# Patient Record
Sex: Female | Born: 1935 | Race: Black or African American | Hispanic: No | Marital: Single | Smoking: Current every day smoker
Health system: Southern US, Community
[De-identification: ages and names within clinical notes are randomized; demographics above are authoritative.]

## PROBLEM LIST (undated history)

## (undated) DIAGNOSIS — J45909 Unspecified asthma, uncomplicated: Secondary | ICD-10-CM

## (undated) DIAGNOSIS — J449 Chronic obstructive pulmonary disease, unspecified: Secondary | ICD-10-CM

## (undated) DIAGNOSIS — I509 Heart failure, unspecified: Secondary | ICD-10-CM

## (undated) DIAGNOSIS — K219 Gastro-esophageal reflux disease without esophagitis: Secondary | ICD-10-CM

## (undated) DIAGNOSIS — E7800 Pure hypercholesterolemia, unspecified: Secondary | ICD-10-CM

## (undated) DIAGNOSIS — N289 Disorder of kidney and ureter, unspecified: Secondary | ICD-10-CM

## (undated) HISTORY — PX: ABDOMINAL HYSTERECTOMY: SHX81

---

## 2018-04-06 ENCOUNTER — Emergency Department (HOSPITAL_COMMUNITY): Payer: Medicare Other

## 2018-04-06 ENCOUNTER — Inpatient Hospital Stay (HOSPITAL_COMMUNITY)
Admission: EM | Admit: 2018-04-06 | Discharge: 2018-04-10 | DRG: 190 | Disposition: A | Discharge status: Home / Self Care | Payer: Medicare Other | Attending: Internal Medicine | Admitting: Internal Medicine

## 2018-04-06 ENCOUNTER — Encounter (HOSPITAL_COMMUNITY): Payer: Self-pay | Admitting: Emergency Medicine

## 2018-04-06 DIAGNOSIS — R0602 Shortness of breath: Secondary | ICD-10-CM | POA: Diagnosis not present

## 2018-04-06 DIAGNOSIS — I130 Hypertensive heart and chronic kidney disease with heart failure and stage 1 through stage 4 chronic kidney disease, or unspecified chronic kidney disease: Secondary | ICD-10-CM | POA: Diagnosis present

## 2018-04-06 DIAGNOSIS — I639 Cerebral infarction, unspecified: Secondary | ICD-10-CM

## 2018-04-06 DIAGNOSIS — I2510 Atherosclerotic heart disease of native coronary artery without angina pectoris: Secondary | ICD-10-CM

## 2018-04-06 DIAGNOSIS — E7800 Pure hypercholesterolemia, unspecified: Secondary | ICD-10-CM | POA: Diagnosis present

## 2018-04-06 DIAGNOSIS — E785 Hyperlipidemia, unspecified: Secondary | ICD-10-CM | POA: Diagnosis present

## 2018-04-06 DIAGNOSIS — N184 Chronic kidney disease, stage 4 (severe): Secondary | ICD-10-CM | POA: Diagnosis present

## 2018-04-06 DIAGNOSIS — N183 Chronic kidney disease, stage 3 unspecified: Secondary | ICD-10-CM

## 2018-04-06 DIAGNOSIS — F0390 Unspecified dementia without behavioral disturbance: Secondary | ICD-10-CM | POA: Diagnosis present

## 2018-04-06 DIAGNOSIS — Z8673 Personal history of transient ischemic attack (TIA), and cerebral infarction without residual deficits: Secondary | ICD-10-CM

## 2018-04-06 DIAGNOSIS — R0989 Other specified symptoms and signs involving the circulatory and respiratory systems: Secondary | ICD-10-CM

## 2018-04-06 DIAGNOSIS — J441 Chronic obstructive pulmonary disease with (acute) exacerbation: Secondary | ICD-10-CM | POA: Diagnosis not present

## 2018-04-06 DIAGNOSIS — D631 Anemia in chronic kidney disease: Secondary | ICD-10-CM | POA: Diagnosis present

## 2018-04-06 DIAGNOSIS — E872 Acidosis: Secondary | ICD-10-CM | POA: Diagnosis present

## 2018-04-06 DIAGNOSIS — J440 Chronic obstructive pulmonary disease with acute lower respiratory infection: Secondary | ICD-10-CM | POA: Diagnosis present

## 2018-04-06 DIAGNOSIS — K219 Gastro-esophageal reflux disease without esophagitis: Secondary | ICD-10-CM | POA: Diagnosis present

## 2018-04-06 DIAGNOSIS — E875 Hyperkalemia: Secondary | ICD-10-CM | POA: Diagnosis not present

## 2018-04-06 DIAGNOSIS — J189 Pneumonia, unspecified organism: Secondary | ICD-10-CM | POA: Diagnosis present

## 2018-04-06 DIAGNOSIS — I248 Other forms of acute ischemic heart disease: Secondary | ICD-10-CM | POA: Diagnosis present

## 2018-04-06 DIAGNOSIS — R778 Other specified abnormalities of plasma proteins: Secondary | ICD-10-CM

## 2018-04-06 DIAGNOSIS — F17210 Nicotine dependence, cigarettes, uncomplicated: Secondary | ICD-10-CM | POA: Diagnosis present

## 2018-04-06 DIAGNOSIS — R7989 Other specified abnormal findings of blood chemistry: Secondary | ICD-10-CM

## 2018-04-06 DIAGNOSIS — Z8249 Family history of ischemic heart disease and other diseases of the circulatory system: Secondary | ICD-10-CM

## 2018-04-06 DIAGNOSIS — I5042 Chronic combined systolic (congestive) and diastolic (congestive) heart failure: Secondary | ICD-10-CM | POA: Diagnosis present

## 2018-04-06 DIAGNOSIS — D696 Thrombocytopenia, unspecified: Secondary | ICD-10-CM | POA: Diagnosis present

## 2018-04-06 HISTORY — DX: Chronic obstructive pulmonary disease, unspecified: J44.9

## 2018-04-06 HISTORY — DX: Disorder of kidney and ureter, unspecified: N28.9

## 2018-04-06 HISTORY — DX: Pure hypercholesterolemia, unspecified: E78.00

## 2018-04-06 HISTORY — DX: Gastro-esophageal reflux disease without esophagitis: K21.9

## 2018-04-06 HISTORY — DX: Heart failure, unspecified: I50.9

## 2018-04-06 HISTORY — DX: Unspecified asthma, uncomplicated: J45.909

## 2018-04-06 LAB — CBC WITH DIFFERENTIAL/PLATELET
Abs Immature Granulocytes: 0.02 10*3/uL (ref 0.00–0.07)
Basophils Absolute: 0.1 10*3/uL (ref 0.0–0.1)
Basophils Relative: 1 %
Eosinophils Absolute: 0.1 10*3/uL (ref 0.0–0.5)
Eosinophils Relative: 1 %
HCT: 32.2 % — ABNORMAL LOW (ref 36.0–46.0)
Hemoglobin: 10 g/dL — ABNORMAL LOW (ref 12.0–15.0)
Immature Granulocytes: 0 %
LYMPHS PCT: 27 %
Lymphs Abs: 1.8 10*3/uL (ref 0.7–4.0)
MCH: 28.5 pg (ref 26.0–34.0)
MCHC: 31.1 g/dL (ref 30.0–36.0)
MCV: 91.7 fL (ref 80.0–100.0)
MONO ABS: 0.6 10*3/uL (ref 0.1–1.0)
Monocytes Relative: 8 %
NEUTROS ABS: 4.2 10*3/uL (ref 1.7–7.7)
Neutrophils Relative %: 63 %
Platelets: 126 10*3/uL — ABNORMAL LOW (ref 150–400)
RBC: 3.51 MIL/uL — AB (ref 3.87–5.11)
RDW: 17.6 % — ABNORMAL HIGH (ref 11.5–15.5)
WBC: 6.8 10*3/uL (ref 4.0–10.5)
nRBC: 0.4 % — ABNORMAL HIGH (ref 0.0–0.2)

## 2018-04-06 LAB — BRAIN NATRIURETIC PEPTIDE: B Natriuretic Peptide: 3300.9 pg/mL — ABNORMAL HIGH (ref 0.0–100.0)

## 2018-04-06 LAB — BASIC METABOLIC PANEL
Anion gap: 10 (ref 5–15)
BUN: 32 mg/dL — AB (ref 8–23)
CHLORIDE: 109 mmol/L (ref 98–111)
CO2: 17 mmol/L — ABNORMAL LOW (ref 22–32)
CREATININE: 2.09 mg/dL — AB (ref 0.44–1.00)
Calcium: 9 mg/dL (ref 8.9–10.3)
GFR calc Af Amer: 24 mL/min — ABNORMAL LOW (ref >60)
GFR calc non Af Amer: 21 mL/min — ABNORMAL LOW (ref >60)
Glucose, Bld: 92 mg/dL (ref 70–99)
POTASSIUM: 4.6 mmol/L (ref 3.5–5.1)
Sodium: 136 mmol/L (ref 135–145)

## 2018-04-06 LAB — TROPONIN I: Troponin I: 0.05 ng/mL (ref <0.03)

## 2018-04-06 MED ORDER — ALBUTEROL (5 MG/ML) CONTINUOUS INHALATION SOLN
10.0000 mg/h | INHALATION_SOLUTION | Freq: Once | RESPIRATORY_TRACT | Status: AC
  Administered 2018-04-06: 10 mg/h via RESPIRATORY_TRACT
  Filled 2018-04-06: qty 20

## 2018-04-06 MED ORDER — SODIUM CHLORIDE 0.9 % IV BOLUS: 1000.0000 mL | Freq: Once | INTRAVENOUS | Status: DC

## 2018-04-06 MED ORDER — MAGNESIUM SULFATE 2 GM/50ML IV SOLN
2.0000 g | Freq: Once | INTRAVENOUS | Status: AC
  Administered 2018-04-06: 2 g via INTRAVENOUS
  Filled 2018-04-06: qty 50

## 2018-04-06 MED ORDER — ALBUTEROL (5 MG/ML) CONTINUOUS INHALATION SOLN
10.0000 mg/h | INHALATION_SOLUTION | Freq: Once | RESPIRATORY_TRACT | Status: AC
  Administered 2018-04-06: 10 mg/h via RESPIRATORY_TRACT

## 2018-04-06 MED ORDER — ALBUTEROL SULFATE (2.5 MG/3ML) 0.083% IN NEBU
5.0000 mg | INHALATION_SOLUTION | Freq: Once | RESPIRATORY_TRACT | Status: AC
  Administered 2018-04-06: 5 mg via RESPIRATORY_TRACT
  Filled 2018-04-06: qty 6

## 2018-04-06 MED ORDER — METHYLPREDNISOLONE SODIUM SUCC 125 MG IJ SOLR
125.0000 mg | Freq: Once | INTRAMUSCULAR | Status: AC
  Administered 2018-04-06: 125 mg via INTRAVENOUS
  Filled 2018-04-06: qty 2

## 2018-04-06 MED ORDER — IPRATROPIUM BROMIDE 0.02 % IN SOLN
1.0000 mg | Freq: Once | RESPIRATORY_TRACT | Status: AC
  Administered 2018-04-06: 1 mg via RESPIRATORY_TRACT
  Filled 2018-04-06: qty 5

## 2018-04-06 NOTE — ED Notes (Signed)
See providers notes

## 2018-04-06 NOTE — H&P (Addendum)
PCP:   Patient, No Pcp Per    Code status: Full Code  Chief Complaint:  SOB, wheezing  HPI: this is a 85 year old female who is [employer].  She lived in [place] until last week when she moved to the area.  She normally gets her care at [facility].  She has a history of COPD and ran out of her albuterol. She has been SOB today.she has a dry nobproductive cough and wheezing. She has a chronic wheeze but it is worse. She denies any fevers or chills. She vomited once today. She denies any diarrhea. She denies any myalgias, light headedness or dizziness. She has a h/o COPD and tobacco use. She is not on home oxygen. She an inhaler at home burt not a nebulizer. She ran out of her inhaler yesterday. She uses her inhaler several times daily at baseline.  History provided by patient and family present at bedside.   Review of Systems:  The patient denies anorexia, fever, weight loss,, vision loss, decreased hearing, hoarseness, SOB, wheezing, , chest pain, syncope, dyspnea on exertion, peripheral edema, balance deficits, hemoptysis, abdominal pain, melena, hematochezia, severe indigestion/heartburn, hematuria, incontinence, genital sores, muscle weakness, suspicious skin lesions, transient blindness, difficulty walking, depression, unusual weight change, abnormal bleeding, enlarged lymph nodes, angioedema, and breast masses.  Past Medical History: Past Medical History:  Diagnosis Date  . Asthma   . CHF (congestive heart failure) ([place])   . COPD (chronic obstructive pulmonary disease) ([place])   . GERD (gastroesophageal reflux disease)   . High cholesterol   . Renal disorder    History reviewed. No pertinent surgical history.  Medications: Prior to Admission medications   REVIEWED    Allergies:  No Known Allergies  Social History:  reports that she has been smoking cigarettes. She has been smoking about 1.00 pack per day. She has never used smokeless tobacco. She reports previous  alcohol use. She reports previous drug use.  Family History: Hypertension  Physical Exam: Vitals:   04/06/18 2130 04/06/18 2145 04/06/18 2200 04/06/18 2313  BP: (!) 164/103 (!) 177/105 (!) 168/96   Pulse: 73  77   Resp: (!) 28 (!) 23 17   Temp:      TempSrc:      SpO2: 97%  100% 98%  Weight:      Height:        General:  Alert and oriented times three, well developed, slender, no acute distress Eyes: PERRLA, pink conjunctiva, no scleral icterus ENT: Moist oral mucosa, neck supple, no thyromegaly Lungs: clear to ascultation, no significant wheeze, no crackles, no use of accessory muscles Cardiovascular: regular rate and rhythm, no regurgitation, no gallops, no murmurs. No carotid bruits, no JVD Abdomen: soft, positive BS, non-tender, non-distended, no organomegaly, not an acute abdomen GU: not examined Neuro: CN II - XII grossly intact, sensation intact Musculoskeletal: strength 5/5 all extremities, no clubbing, cyanosis or edema Skin: no rash, no subcutaneous crepitation, no decubitus Psych: appropriate patient   Labs on Admission:  Recent Labs    04/06/18 2217  NA 136  K 4.6  CL 109  CO2 17*  GLUCOSE 92  BUN 32*  CREATININE 2.09*  CALCIUM 9.0   No results for input(s): AST, ALT, ALKPHOS, BILITOT, PROT, ALBUMIN in the last 72 hours. No results for input(s): LIPASE, AMYLASE in the last 72 hours. Recent Labs    04/06/18 2217  WBC 6.8  NEUTROABS 4.2  HGB 10.0*  HCT 32.2*  MCV 91.7  PLT 126*  Recent Labs    04/06/18 2217  TROPONINI 0.05*   Invalid input(s): POCBNP No results for input(s): DDIMER in the last 72 hours. No results for input(s): HGBA1C in the last 72 hours. No results for input(s): CHOL, HDL, LDLCALC, TRIG, CHOLHDL, LDLDIRECT in the last 72 hours. No results for input(s): TSH, T4TOTAL, T3FREE, THYROIDAB in the last 72 hours.  Invalid input(s): FREET3 No results for input(s): VITAMINB12, FOLATE, FERRITIN, TIBC, IRON, RETICCTPCT in the last  72 hours.  Micro Results: No results found for this or any previous visit (from the past 240 hour(s)).   Radiological Exams on Admission: Dg Chest 2 View  Result Date: 04/06/2018 CLINICAL DATA:  Initial evaluation for acute shortness of breath. History of COPD. EXAM: CHEST - 2 VIEW COMPARISON:  None available. FINDINGS: Cardiomegaly. Mediastinal silhouette within normal limits. Aortic atherosclerosis. Lungs hyperinflated with evidence for upper lobe predominant emphysema. Irregular pleuroparenchymal scarring at the bilateral lung apices, right greater than left. No focal infiltrates. No pulmonary edema or pleural effusion. No pneumothorax. No acute osseous abnormality.  Osteopenia noted. IMPRESSION: 1. COPD.  No superimposed active cardiopulmonary disease. 2. Cardiomegaly without pulmonary edema. 3. Aortic atherosclerosis. Electronically Signed   By: Jeannine Boga M.D.   On: 04/06/2018 21:44    Assessment/Plan Present on Admission: . COPD with acute exacerbation ([place]) -Admit to MedSurg -Solu-Medrol, duo nebs, oxygen as needed keep sats greater than 88% -No antibiotics ordered as no evidence of pneumonia -Respiratory to evaluate and treat  Tobacco use -Nicotine patch 21 Mg daily, DuoNeb  H/o CHF -Aware  CKD -Aware, will follow.  No baseline labs to compare  H/o CVA -Stable, home meds resumed  Hypertension -Stable, home meds resumed  Dyslipidemia -Stable, home meds resumed   Blaine Guiffre 04/06/2018, 11:35 PM

## 2018-04-06 NOTE — ED Triage Notes (Signed)
Per family pt has been SOB X1 day, pt is out of inhaler. Family states she is also out of all her meds.

## 2018-04-06 NOTE — ED Provider Notes (Signed)
[facility] EMERGENCY DEPARTMENT Provider Note   CSN: [id number] Arrival date & time: 04/06/18  2047     History   Chief Complaint Chief Complaint  Patient presents with  . Shortness of Breath    HPI Maureen Mendez is a 85 y.o. female.  HPI   85 yo F with h/o CHF, HLD, COPD, here with SOB. History limited 2/2 mild baseline dementia and SOB. Pt states that she has recently run out of albuterol. Over the past 2-3 days, she's had increasing SOB, wheezing though family states her sx acutely worsened tonight. She began having increased WOB and complaining of SOB, along with non productive cough. They did not have inhaler so came to the ED. On my assessment, pt does state that she cannot catch her breath. Denies any CP. No LE edema or swelling. No abd pain.  Level 5 caveat invoked as remainder of history, ROS, and physical exam limited due to patient's SOB, dementia.   Past Medical History:  Diagnosis Date  . Asthma   . CHF (congestive heart failure) ([place])   . COPD (chronic obstructive pulmonary disease) ([place])   . GERD (gastroesophageal reflux disease)   . High cholesterol   . Renal disorder     There are no active problems to display for this patient.   History reviewed. No pertinent surgical history.   OB History   No obstetric history on file.      Home Medications    Prior to Admission medications   Not on File    Family History No family history on file.  Social History Social History   Tobacco Use  . Smoking status: Current Every Day Smoker    Packs/day: 1.00    Types: Cigarettes  . Smokeless tobacco: Never Used  Substance Use Topics  . Alcohol use: Not Currently  . Drug use: Not Currently     Allergies   Patient has no known allergies.   Review of Systems Review of Systems  Constitutional: Positive for fatigue. Negative for chills and fever.  HENT: Negative for congestion and rhinorrhea.   Eyes: Negative for visual  disturbance.  Respiratory: Positive for cough, shortness of breath and wheezing.   Cardiovascular: Negative for chest pain and leg swelling.  Gastrointestinal: Negative for abdominal pain, diarrhea, nausea and vomiting.  Genitourinary: Negative for dysuria and flank pain.  Musculoskeletal: Negative for neck pain and neck stiffness.  Skin: Negative for rash and wound.  Allergic/Immunologic: Negative for immunocompromised state.  Neurological: Negative for syncope, weakness and headaches.  All other systems reviewed and are negative.    Physical Exam Updated Vital Signs BP (!) 168/96   Pulse 77   Temp 98.1 F (36.7 C) (Oral)   Resp 17   Ht 5\' 6"  (1.676 m)   Wt 61.2 kg   SpO2 98%   BMI 21.79 kg/m   Physical Exam Vitals signs and nursing note reviewed.  Constitutional:      General: She is in acute distress.     Appearance: She is well-developed. She is ill-appearing.  HENT:     Head: Normocephalic and atraumatic.  Eyes:     Conjunctiva/sclera: Conjunctivae normal.  Neck:     Musculoskeletal: Neck supple.  Cardiovascular:     Rate and Rhythm: Normal rate and regular rhythm.     Heart sounds: Normal heart sounds. No murmur. No friction rub.  Pulmonary:     Effort: Tachypnea and respiratory distress present.     Breath sounds:  Examination of the right-upper field reveals wheezing. Examination of the left-upper field reveals wheezing. Examination of the right-middle field reveals wheezing. Examination of the left-middle field reveals wheezing. Examination of the right-lower field reveals wheezing. Examination of the left-lower field reveals wheezing. Decreased breath sounds and wheezing present. No rales.  Abdominal:     General: There is no distension.     Palpations: Abdomen is soft.     Tenderness: There is no abdominal tenderness.  Skin:    General: Skin is warm.     Capillary Refill: Capillary refill takes less than 2 seconds.  Neurological:     Mental Status: She is  alert and oriented to person, place, and time.     Motor: No abnormal muscle tone.      ED Treatments / Results  Labs (all labs ordered are listed, but only abnormal results are displayed) Labs Reviewed  CBC WITH DIFFERENTIAL/PLATELET - Abnormal; Notable for the following components:      Result Value   RBC 3.51 (*)    Hemoglobin 10.0 (*)    HCT 32.2 (*)    RDW 17.6 (*)    Platelets 126 (*)    nRBC 0.4 (*)    All other components within normal limits  BASIC METABOLIC PANEL - Abnormal; Notable for the following components:   CO2 17 (*)    BUN 32 (*)    Creatinine, Ser 2.09 (*)    GFR calc non Af Amer 21 (*)    GFR calc Af Amer 24 (*)    All other components within normal limits  TROPONIN I - Abnormal; Notable for the following components:   Troponin I 0.05 (*)    All other components within normal limits  BRAIN NATRIURETIC PEPTIDE    EKG EKG Interpretation  Date/Time:  Sunday April 06 2018 21:26:37 EST Ventricular Rate:  78 PR Interval:    QRS Duration: 84 QT Interval:  443 QTC Calculation: 495 R Axis:   148 Text Interpretation:  Sinus rhythm Ventricular premature complex Probable left atrial enlargement Right axis deviation Borderline low voltage, extremity leads Abnormal lateral Q waves Nonspecific T abnormalities, inferior leads No old tracing to compare No ST Elevations  Confirmed by Duffy Bruce [id number]) on 04/06/2018 11:25:14 PM   Radiology Dg Chest 2 View  Result Date: 04/06/2018 CLINICAL DATA:  Initial evaluation for acute shortness of breath. History of COPD. EXAM: CHEST - 2 VIEW COMPARISON:  None available. FINDINGS: Cardiomegaly. Mediastinal silhouette within normal limits. Aortic atherosclerosis. Lungs hyperinflated with evidence for upper lobe predominant emphysema. Irregular pleuroparenchymal scarring at the bilateral lung apices, right greater than left. No focal infiltrates. No pulmonary edema or pleural effusion. No pneumothorax. No acute osseous  abnormality.  Osteopenia noted. IMPRESSION: 1. COPD.  No superimposed active cardiopulmonary disease. 2. Cardiomegaly without pulmonary edema. 3. Aortic atherosclerosis. Electronically Signed   By: Jeannine Boga M.D.   On: 04/06/2018 21:44    Procedures .Critical Care Performed by: Duffy Bruce, MD Authorized by: Duffy Bruce, MD   Critical care provider statement:    Critical care time (minutes):  35   Critical care time was exclusive of:  Separately billable procedures and treating other patients and teaching time   Critical care was necessary to treat or prevent imminent or life-threatening deterioration of the following conditions:  Circulatory failure, cardiac failure and respiratory failure   Critical care was time spent personally by me on the following activities:  Development of treatment plan with patient or surrogate, discussions  with consultants, evaluation of patient's response to treatment, examination of patient, obtaining history from patient or surrogate, ordering and performing treatments and interventions, ordering and review of laboratory studies, ordering and review of radiographic studies, pulse oximetry, re-evaluation of patient's condition and review of old charts   I assumed direction of critical care for this patient from another provider in my specialty: no     (including critical care time)  Medications Ordered in ED Medications  sodium chloride 0.9 % bolus 1,000 mL (has no administration in time range)  albuterol (PROVENTIL) (2.5 MG/3ML) 0.083% nebulizer solution 5 mg (5 mg Nebulization Given 04/06/18 2100)  methylPREDNISolone sodium succinate (SOLU-MEDROL) 125 mg/2 mL injection 125 mg (125 mg Intravenous Given 04/06/18 2225)  albuterol (PROVENTIL,VENTOLIN) solution continuous neb (10 mg/hr Nebulization Given 04/06/18 2129)  ipratropium (ATROVENT) nebulizer solution 1 mg (1 mg Nebulization Given 04/06/18 2129)  magnesium sulfate IVPB 2 g 50 mL (2 g  Intravenous New Bag/Given 04/06/18 2226)  albuterol (PROVENTIL,VENTOLIN) solution continuous neb (10 mg/hr Nebulization Given 04/06/18 2313)     Initial Impression / Assessment and Plan / ED Course  I have reviewed the triage vital signs and the nursing notes.  Pertinent labs & imaging results that were available during my care of the patient were reviewed by me and considered in my medical decision making (see chart for details).     86 yo F with PMHx COPD, CHF here with SOB after running out her albuterol. Pt in obvious resp distress on arrival but speaking in full sentences. CAT, steroids, mag given with significant improvement in aeration and WOB. She feels much better but remains tachypneic and satting 90% on RA. Will give additional nebs, admit for COPD exacerbation. Of note, labs also show likely AKI with dehydration, as well as likely demand ischemia. IVF given. No CP currently, EKG is without ST elevations.  Final Clinical Impressions(s) / ED Diagnoses   Final diagnoses:  COPD exacerbation [facility])    ED Discharge Orders    None       Duffy Bruce, MD 04/06/18 2328

## 2018-04-07 ENCOUNTER — Encounter (HOSPITAL_COMMUNITY): Payer: Self-pay | Admitting: General Practice

## 2018-04-07 ENCOUNTER — Other Ambulatory Visit: Payer: Self-pay

## 2018-04-07 DIAGNOSIS — I361 Nonrheumatic tricuspid (valve) insufficiency: Secondary | ICD-10-CM | POA: Diagnosis not present

## 2018-04-07 DIAGNOSIS — E7800 Pure hypercholesterolemia, unspecified: Secondary | ICD-10-CM | POA: Diagnosis present

## 2018-04-07 DIAGNOSIS — Z8673 Personal history of transient ischemic attack (TIA), and cerebral infarction without residual deficits: Secondary | ICD-10-CM | POA: Diagnosis not present

## 2018-04-07 DIAGNOSIS — R7989 Other specified abnormal findings of blood chemistry: Secondary | ICD-10-CM

## 2018-04-07 DIAGNOSIS — I2510 Atherosclerotic heart disease of native coronary artery without angina pectoris: Secondary | ICD-10-CM | POA: Diagnosis present

## 2018-04-07 DIAGNOSIS — J440 Chronic obstructive pulmonary disease with acute lower respiratory infection: Secondary | ICD-10-CM | POA: Diagnosis present

## 2018-04-07 DIAGNOSIS — F0390 Unspecified dementia without behavioral disturbance: Secondary | ICD-10-CM | POA: Diagnosis present

## 2018-04-07 DIAGNOSIS — N184 Chronic kidney disease, stage 4 (severe): Secondary | ICD-10-CM | POA: Diagnosis present

## 2018-04-07 DIAGNOSIS — I5042 Chronic combined systolic (congestive) and diastolic (congestive) heart failure: Secondary | ICD-10-CM | POA: Diagnosis present

## 2018-04-07 DIAGNOSIS — D696 Thrombocytopenia, unspecified: Secondary | ICD-10-CM | POA: Diagnosis present

## 2018-04-07 DIAGNOSIS — J441 Chronic obstructive pulmonary disease with (acute) exacerbation: Secondary | ICD-10-CM | POA: Diagnosis present

## 2018-04-07 DIAGNOSIS — J189 Pneumonia, unspecified organism: Secondary | ICD-10-CM | POA: Diagnosis present

## 2018-04-07 DIAGNOSIS — E875 Hyperkalemia: Secondary | ICD-10-CM | POA: Diagnosis not present

## 2018-04-07 DIAGNOSIS — I130 Hypertensive heart and chronic kidney disease with heart failure and stage 1 through stage 4 chronic kidney disease, or unspecified chronic kidney disease: Secondary | ICD-10-CM | POA: Diagnosis present

## 2018-04-07 DIAGNOSIS — E872 Acidosis: Secondary | ICD-10-CM | POA: Diagnosis present

## 2018-04-07 DIAGNOSIS — K219 Gastro-esophageal reflux disease without esophagitis: Secondary | ICD-10-CM | POA: Diagnosis present

## 2018-04-07 DIAGNOSIS — Z8249 Family history of ischemic heart disease and other diseases of the circulatory system: Secondary | ICD-10-CM | POA: Diagnosis not present

## 2018-04-07 DIAGNOSIS — F17210 Nicotine dependence, cigarettes, uncomplicated: Secondary | ICD-10-CM | POA: Diagnosis present

## 2018-04-07 DIAGNOSIS — I248 Other forms of acute ischemic heart disease: Secondary | ICD-10-CM | POA: Diagnosis present

## 2018-04-07 DIAGNOSIS — I340 Nonrheumatic mitral (valve) insufficiency: Secondary | ICD-10-CM | POA: Diagnosis not present

## 2018-04-07 DIAGNOSIS — R0602 Shortness of breath: Secondary | ICD-10-CM | POA: Diagnosis present

## 2018-04-07 DIAGNOSIS — E785 Hyperlipidemia, unspecified: Secondary | ICD-10-CM | POA: Diagnosis present

## 2018-04-07 DIAGNOSIS — D631 Anemia in chronic kidney disease: Secondary | ICD-10-CM | POA: Diagnosis present

## 2018-04-07 LAB — CBC
HCT: 29.5 % — ABNORMAL LOW (ref 36.0–46.0)
Hemoglobin: 9.2 g/dL — ABNORMAL LOW (ref 12.0–15.0)
MCH: 29 pg (ref 26.0–34.0)
MCHC: 31.2 g/dL (ref 30.0–36.0)
MCV: 93.1 fL (ref 80.0–100.0)
NRBC: 0.6 % — AB (ref 0.0–0.2)
PLATELETS: 124 10*3/uL — AB (ref 150–400)
RBC: 3.17 MIL/uL — ABNORMAL LOW (ref 3.87–5.11)
RDW: 17.5 % — ABNORMAL HIGH (ref 11.5–15.5)
WBC: 7.2 10*3/uL (ref 4.0–10.5)

## 2018-04-07 LAB — BASIC METABOLIC PANEL
ANION GAP: 12 (ref 5–15)
BUN: 30 mg/dL — ABNORMAL HIGH (ref 8–23)
CO2: 14 mmol/L — ABNORMAL LOW (ref 22–32)
Calcium: 8.7 mg/dL — ABNORMAL LOW (ref 8.9–10.3)
Chloride: 112 mmol/L — ABNORMAL HIGH (ref 98–111)
Creatinine, Ser: 2.04 mg/dL — ABNORMAL HIGH (ref 0.44–1.00)
GFR calc Af Amer: 25 mL/min — ABNORMAL LOW (ref >60)
GFR, EST NON AFRICAN AMERICAN: 22 mL/min — AB (ref >60)
Glucose, Bld: 130 mg/dL — ABNORMAL HIGH (ref 70–99)
Potassium: 4.6 mmol/L (ref 3.5–5.1)
Sodium: 138 mmol/L (ref 135–145)

## 2018-04-07 LAB — MAGNESIUM: Magnesium: 2.5 mg/dL — ABNORMAL HIGH (ref 1.7–2.4)

## 2018-04-07 MED ORDER — HYDROXYZINE HCL 10 MG PO TABS
10.0000 mg | ORAL_TABLET | Freq: Three times a day (TID) | ORAL | Status: DC | PRN
  Filled 2018-04-07: qty 1

## 2018-04-07 MED ORDER — ASPIRIN EC 81 MG PO TBEC
81.0000 mg | DELAYED_RELEASE_TABLET | Freq: Every day | ORAL | Status: DC
  Administered 2018-04-07 – 2018-04-10 (×4): 81 mg via ORAL
  Filled 2018-04-07 (×4): qty 1

## 2018-04-07 MED ORDER — METOPROLOL SUCCINATE ER 25 MG PO TB24
25.0000 mg | ORAL_TABLET | Freq: Every day | ORAL | Status: DC
  Administered 2018-04-07 – 2018-04-10 (×4): 25 mg via ORAL
  Filled 2018-04-07 (×4): qty 1

## 2018-04-07 MED ORDER — POLYETHYLENE GLYCOL 3350 17 G PO PACK: 17.0000 g | PACK | Freq: Every day | ORAL | Status: DC | PRN

## 2018-04-07 MED ORDER — HEPARIN SODIUM (PORCINE) 5000 UNIT/ML IJ SOLN
5000.0000 [IU] | Freq: Three times a day (TID) | INTRAMUSCULAR | Status: DC
  Administered 2018-04-07 – 2018-04-10 (×9): 5000 [IU] via SUBCUTANEOUS
  Filled 2018-04-07 (×9): qty 1

## 2018-04-07 MED ORDER — AZITHROMYCIN 500 MG PO TABS
250.0000 mg | ORAL_TABLET | Freq: Every day | ORAL | Status: DC
  Administered 2018-04-08 – 2018-04-10 (×3): 250 mg via ORAL
  Filled 2018-04-07 (×3): qty 1

## 2018-04-07 MED ORDER — ALUM & MAG HYDROXIDE-SIMETH 200-200-20 MG/5ML PO SUSP
15.0000 mL | Freq: Four times a day (QID) | ORAL | Status: DC | PRN
  Filled 2018-04-07: qty 30

## 2018-04-07 MED ORDER — DOCUSATE SODIUM 100 MG PO CAPS
100.0000 mg | ORAL_CAPSULE | Freq: Two times a day (BID) | ORAL | Status: DC | PRN
  Administered 2018-04-07: 100 mg via ORAL
  Filled 2018-04-07: qty 1

## 2018-04-07 MED ORDER — CALCITRIOL 0.25 MCG PO CAPS
0.5000 ug | ORAL_CAPSULE | ORAL | Status: DC
  Administered 2018-04-07 – 2018-04-10 (×4): 0.5 ug via ORAL
  Filled 2018-04-07: qty 2
  Filled 2018-04-07: qty 1
  Filled 2018-04-07 (×3): qty 2

## 2018-04-07 MED ORDER — ATORVASTATIN CALCIUM 80 MG PO TABS
80.0000 mg | ORAL_TABLET | Freq: Every evening | ORAL | Status: DC
  Administered 2018-04-07 – 2018-04-09 (×3): 80 mg via ORAL
  Filled 2018-04-07 (×3): qty 1

## 2018-04-07 MED ORDER — AZITHROMYCIN 500 MG PO TABS
500.0000 mg | ORAL_TABLET | Freq: Every day | ORAL | Status: AC
  Administered 2018-04-07: 500 mg via ORAL
  Filled 2018-04-07: qty 1

## 2018-04-07 MED ORDER — NICOTINE 21 MG/24HR TD PT24
21.0000 mg | MEDICATED_PATCH | Freq: Every day | TRANSDERMAL | Status: DC
  Administered 2018-04-07: 21 mg via TRANSDERMAL
  Filled 2018-04-07: qty 1

## 2018-04-07 MED ORDER — ACETAMINOPHEN 325 MG PO TABS
650.0000 mg | ORAL_TABLET | Freq: Four times a day (QID) | ORAL | Status: DC | PRN
  Administered 2018-04-07 – 2018-04-09 (×3): 650 mg via ORAL
  Filled 2018-04-07 (×3): qty 2

## 2018-04-07 MED ORDER — IPRATROPIUM-ALBUTEROL 0.5-2.5 (3) MG/3ML IN SOLN
3.0000 mL | Freq: Four times a day (QID) | RESPIRATORY_TRACT | Status: DC
  Administered 2018-04-07 – 2018-04-08 (×6): 3 mL via RESPIRATORY_TRACT
  Filled 2018-04-07 (×6): qty 3

## 2018-04-07 MED ORDER — ACETAMINOPHEN 650 MG RE SUPP: 650.0000 mg | Freq: Four times a day (QID) | RECTAL | Status: DC | PRN

## 2018-04-07 MED ORDER — METHYLPREDNISOLONE SODIUM SUCC 40 MG IJ SOLR
40.0000 mg | Freq: Two times a day (BID) | INTRAMUSCULAR | Status: DC
  Administered 2018-04-07 – 2018-04-10 (×7): 40 mg via INTRAVENOUS
  Filled 2018-04-07 (×7): qty 1

## 2018-04-07 MED ORDER — NICOTINE 7 MG/24HR TD PT24
7.0000 mg | MEDICATED_PATCH | Freq: Every day | TRANSDERMAL | Status: DC
  Administered 2018-04-07 – 2018-04-10 (×4): 7 mg via TRANSDERMAL
  Filled 2018-04-07 (×4): qty 1

## 2018-04-07 MED ORDER — METHYLPREDNISOLONE SODIUM SUCC 125 MG IJ SOLR: 60.0000 mg | Freq: Three times a day (TID) | INTRAMUSCULAR | Status: DC

## 2018-04-07 MED ORDER — PANTOPRAZOLE SODIUM 40 MG PO TBEC
40.0000 mg | DELAYED_RELEASE_TABLET | Freq: Every day | ORAL | Status: DC
  Administered 2018-04-07 – 2018-04-10 (×4): 40 mg via ORAL
  Filled 2018-04-07 (×4): qty 1

## 2018-04-07 NOTE — Progress Notes (Signed)
Patient complaining of lower chest pain, intermittently, most of the time after she is coughing. VS stable, MD made aware. Will continue to monitor.

## 2018-04-07 NOTE — ED Notes (Signed)
Patient's daughter is going home for the night and has requested that we call her when her mother gets a room assigned.

## 2018-04-07 NOTE — Progress Notes (Signed)
Patient trasfered from ED to 5W10 via stretcher; alert and oriented x 4; no complaints of pain; IV saline locked in RAC; skin intact, dry. Orient patient to room and unit; gave patient care guide; instructed how to use the call bell and  fall risk precautions. Will continue to monitor the patient.

## 2018-04-07 NOTE — Progress Notes (Signed)
Staff tried to walk with the patient to check pulse oximetry while ambulating but patient refused; she verbalized that she is not feeling well and she will try tomorrow. Heat was applied on the RUQ to help pain and also, patient requested stool softener. Will continue to monitor.

## 2018-04-07 NOTE — Progress Notes (Addendum)
PROGRESS NOTE    Maureen Mendez   FBP:[id number]  DOB: [birth year]  DOA: 04/06/2018 PCP: Patient, No Pcp Per   Brief Narrative:  Maureen Mendez is an 85 y/o female who has moved here from [facility] to live with her daughter and comes in for cough and wheezing associated with dyspnea. She is out of her Albuterol inhaler.    Subjective: Still has a cough and shortness of breath.     Assessment & Plan:   Principal Problem:   COPD with acute exacerbation - cigarette smoker - is still wheezing today- cont IV steroids- will need script for inhaler as her refills are in [facility]-  - also has coarse crackles in left lung base although no radialogic evidence of pneumonia - start a Z pak (for COPD exacerbation) -cont Nebs - has been advised to quit smoking- cont Nicotine patch  Active Problems:   CAD (coronary artery disease) -cont ASA 81 mg, Toprol and Lipitor     CKD (chronic kidney disease), stage IV?  Metabolic acidosis - no prior labs to compare with- follow     Elevated troponin - noted to be 0.05 in ED- not repeated as she had no chest pain- likely was either   Demand ischemia or due to CKD  Anemia and mild thrombocytopenia - follow - again no old labs to compare with  Time spent in minutes: 35 DVT prophylaxis: Heparin Code Status: Full code Family Communication:  Disposition Plan: home - needs a local PCP Consultants:   none Procedures:   none Antimicrobials:  Anti-infectives (From admission, onward)   None       Objective: Vitals:   04/07/18 0808 04/07/18 0845 04/07/18 1415 04/07/18 1515  BP: 135/88   (!) 154/89  Pulse: 86   88  Resp: 19   16  Temp: 98.5 F (36.9 C)     TempSrc: Oral     SpO2: 98% 96% 96% 95%  Weight: 59 kg     Height: 5\' 6"  (1.676 m)       Intake/Output Summary (Last 24 hours) at 04/07/2018 1630 Last data filed at 04/07/2018 1217 Gross per 24 hour  Intake 240 ml  Output -  Net 240 ml   Filed Weights   04/06/18 2055 04/07/18  0808  Weight: 61.2 kg 59 kg    Examination: General exam: Appears comfortable  HEENT: PERRLA, oral mucosa moist, no sclera icterus or thrush Respiratory system: wheezing in left lung with crackles in Left lung base Cardiovascular system: S1 & S2 heard, RRR.   Gastrointestinal system: Abdomen soft, non-tender, nondistended. Normal bowel sounds. Central nervous system: Alert and oriented. No focal neurological deficits. Extremities: No cyanosis, clubbing or edema Skin: No rashes or ulcers Psychiatry:  Mood & affect appropriate.     Data Reviewed: I have personally reviewed following labs and imaging studies  CBC: Recent Labs  Lab 04/06/18 2217 04/07/18 0220  WBC 6.8 7.2  NEUTROABS 4.2  --   HGB 10.0* 9.2*  HCT 32.2* 29.5*  MCV 91.7 93.1  PLT 126* 824*   Basic Metabolic Panel: Recent Labs  Lab 04/06/18 2217 04/07/18 0220  NA 136 138  K 4.6 4.6  CL 109 112*  CO2 17* 14*  GLUCOSE 92 130*  BUN 32* 30*  CREATININE 2.09* 2.04*  CALCIUM 9.0 8.7*  MG  --  2.5*   GFR: Estimated Creatinine Clearance: 18.4 mL/min (A) (by C-G formula based on SCr of 2.04 mg/dL (H)). Liver Function Tests: No results for input(s):  AST, ALT, ALKPHOS, BILITOT, PROT, ALBUMIN in the last 168 hours. No results for input(s): LIPASE, AMYLASE in the last 168 hours. No results for input(s): AMMONIA in the last 168 hours. Coagulation Profile: No results for input(s): INR, PROTIME in the last 168 hours. Cardiac Enzymes: Recent Labs  Lab 04/06/18 2217  TROPONINI 0.05*   BNP (last 3 results) No results for input(s): PROBNP in the last 8760 hours. HbA1C: No results for input(s): HGBA1C in the last 72 hours. CBG: No results for input(s): GLUCAP in the last 168 hours. Lipid Profile: No results for input(s): CHOL, HDL, LDLCALC, TRIG, CHOLHDL, LDLDIRECT in the last 72 hours. Thyroid Function Tests: No results for input(s): TSH, T4TOTAL, FREET4, T3FREE, THYROIDAB in the last 72 hours. Anemia  Panel: No results for input(s): VITAMINB12, FOLATE, FERRITIN, TIBC, IRON, RETICCTPCT in the last 72 hours. Urine analysis: No results found for: COLORURINE, APPEARANCEUR, LABSPEC, PHURINE, GLUCOSEU, HGBUR, BILIRUBINUR, KETONESUR, PROTEINUR, UROBILINOGEN, NITRITE, LEUKOCYTESUR Sepsis Labs: @LABRCNTIP (procalcitonin:4,lacticidven:4) )No results found for this or any previous visit (from the past 240 hour(s)).       Radiology Studies: Dg Chest 2 View  Result Date: 04/06/2018 CLINICAL DATA:  Initial evaluation for acute shortness of breath. History of COPD. EXAM: CHEST - 2 VIEW COMPARISON:  None available. FINDINGS: Cardiomegaly. Mediastinal silhouette within normal limits. Aortic atherosclerosis. Lungs hyperinflated with evidence for upper lobe predominant emphysema. Irregular pleuroparenchymal scarring at the bilateral lung apices, right greater than left. No focal infiltrates. No pulmonary edema or pleural effusion. No pneumothorax. No acute osseous abnormality.  Osteopenia noted. IMPRESSION: 1. COPD.  No superimposed active cardiopulmonary disease. 2. Cardiomegaly without pulmonary edema. 3. Aortic atherosclerosis. Electronically Signed   By: Jeannine Boga M.D.   On: 04/06/2018 21:44      Scheduled Meds: . aspirin EC  81 mg Oral Daily  . atorvastatin  80 mg Oral QPM  . calcitRIOL  0.5 mcg Oral Q MTWThF  . heparin  5,000 Units Subcutaneous Q8H  . ipratropium-albuterol  3 mL Nebulization Q6H  . methylPREDNISolone (SOLU-MEDROL) injection  40 mg Intravenous Q12H  . metoprolol succinate  25 mg Oral Daily  . nicotine  7 mg Transdermal Daily  . pantoprazole  40 mg Oral Daily   Continuous Infusions:   LOS: 0 days      Debbe Odea, MD [facility] Pager: [email or web address] Password [email or web address] 04/07/2018, 4:30 PM

## 2018-04-08 ENCOUNTER — Inpatient Hospital Stay (HOSPITAL_COMMUNITY): Payer: Medicare Other

## 2018-04-08 DIAGNOSIS — I340 Nonrheumatic mitral (valve) insufficiency: Secondary | ICD-10-CM

## 2018-04-08 DIAGNOSIS — I361 Nonrheumatic tricuspid (valve) insufficiency: Secondary | ICD-10-CM

## 2018-04-08 LAB — BASIC METABOLIC PANEL
Anion gap: 10 (ref 5–15)
BUN: 41 mg/dL — ABNORMAL HIGH (ref 8–23)
CO2: 16 mmol/L — ABNORMAL LOW (ref 22–32)
Calcium: 9.3 mg/dL (ref 8.9–10.3)
Chloride: 110 mmol/L (ref 98–111)
Creatinine, Ser: 2.49 mg/dL — ABNORMAL HIGH (ref 0.44–1.00)
GFR calc Af Amer: 20 mL/min — ABNORMAL LOW (ref >60)
GFR calc non Af Amer: 17 mL/min — ABNORMAL LOW (ref >60)
Glucose, Bld: 169 mg/dL — ABNORMAL HIGH (ref 70–99)
Potassium: 5.3 mmol/L — ABNORMAL HIGH (ref 3.5–5.1)
Sodium: 136 mmol/L (ref 135–145)

## 2018-04-08 LAB — ECHOCARDIOGRAM COMPLETE
Height: 66 in
WEIGHTICAEL: 2081.14 [oz_av]

## 2018-04-08 LAB — CBC
HCT: 28.5 % — ABNORMAL LOW (ref 36.0–46.0)
Hemoglobin: 9.4 g/dL — ABNORMAL LOW (ref 12.0–15.0)
MCH: 29.6 pg (ref 26.0–34.0)
MCHC: 33 g/dL (ref 30.0–36.0)
MCV: 89.6 fL (ref 80.0–100.0)
Platelets: 142 10*3/uL — ABNORMAL LOW (ref 150–400)
RBC: 3.18 MIL/uL — ABNORMAL LOW (ref 3.87–5.11)
RDW: 17.4 % — AB (ref 11.5–15.5)
WBC: 9.6 10*3/uL (ref 4.0–10.5)
nRBC: 1.7 % — ABNORMAL HIGH (ref 0.0–0.2)

## 2018-04-08 MED ORDER — IPRATROPIUM-ALBUTEROL 0.5-2.5 (3) MG/3ML IN SOLN
3.0000 mL | Freq: Two times a day (BID) | RESPIRATORY_TRACT | Status: DC
  Administered 2018-04-08 – 2018-04-09 (×2): 3 mL via RESPIRATORY_TRACT
  Filled 2018-04-08 (×2): qty 3

## 2018-04-08 NOTE — Progress Notes (Signed)
SATURATION QUALIFICATIONS: (This note is used to comply with regulatory documentation for home oxygen)  Patient Saturations on Room Air at Rest = 96%  Patient Saturations on Room Air while Ambulating = 80%  Patient Saturations on 2 Liters of oxygen while Ambulating = 93%  Please briefly explain why patient needs home oxygen:

## 2018-04-08 NOTE — Plan of Care (Signed)

## 2018-04-08 NOTE — Progress Notes (Signed)
  Echocardiogram 2D Echocardiogram has been performed.  Maureen Mendez 04/08/2018, 4:56 PM

## 2018-04-08 NOTE — Progress Notes (Addendum)
PROGRESS NOTE    Ayelen Sciortino   CZY:[id number]  DOB: February 15, 1930  DOA: 04/06/2018 PCP: Patient, No Pcp Per   Brief Narrative:  Eddye Broxterman is an 85 y/o female who has moved here from [facility] to live with her daughter and comes in for cough and wheezing associated with dyspnea. She is out of her Albuterol inhaler.    Subjective: Would like to go home today but noted to still be coughing.     Assessment & Plan:   Principal Problem:   COPD with acute exacerbation - cigarette smoker - is still wheezing today-pulse ox in 80s when she ambulates-  - also has coarse crackles in left lung base although no radialogic evidence of pneumonia - repeat CXR today - started a Z pak (for COPD exacerbation) -cont Nebs - has been advised to quit smoking- cont Nicotine patch  Active Problems:   CAD (coronary artery disease) -cont ASA 81 mg, Toprol and Lipitor  Elevated BNP, enlarge heart on CXR, dyspnea and hypoxia on exertion - will obtain ECHO    Mild hyperkalemia - follow- recheck later today    CKD (chronic kidney disease), stage IV?  Metabolic acidosis - no prior labs to compare with- follow     Elevated troponin - noted to be 0.05 in ED- not repeated as she had no chest pain- likely was either   Demand ischemia or due to CKD  Anemia and mild thrombocytopenia - follow - again no old labs to compare with- platelets slightly better today  Time spent in minutes: 35 DVT prophylaxis: Heparin Code Status: Full code Family Communication:  Disposition Plan: home - needs a local PCP Consultants:   none Procedures:   none Antimicrobials:  Anti-infectives (From admission, onward)   Start     Dose/Rate Route Frequency Ordered Stop   04/08/18 1000  azithromycin (ZITHROMAX) tablet 250 mg     250 mg Oral Daily 04/07/18 1632 04/12/18 0959   04/07/18 1700  azithromycin (ZITHROMAX) tablet 500 mg     500 mg Oral Daily 04/07/18 1632 04/07/18 1659       Objective: Vitals:   04/08/18  0351 04/08/18 0730 04/08/18 0930 04/08/18 1347  BP: (!) 155/94   (!) 136/93  Pulse: 77   74  Resp: 15   20  Temp: 97.9 F (36.6 C)   (!) 97.3 F (36.3 C)  TempSrc: Oral   Oral  SpO2: 92% 95% 95% 100%  Weight:      Height:        Intake/Output Summary (Last 24 hours) at 04/08/2018 1535 Last data filed at 04/07/2018 1808 Gross per 24 hour  Intake 180 ml  Output -  Net 180 ml   Filed Weights   04/06/18 2055 04/07/18 0808  Weight: 61.2 kg 59 kg    Examination: General exam: Appears comfortable  HEENT: PERRLA, oral mucosa moist, no sclera icterus or thrush Respiratory system: wheezing in left lung with crackles in Left lung base Cardiovascular system: S1 & S2 heard, RRR.   Gastrointestinal system: Abdomen soft, non-tender, nondistended. Normal bowel sounds. Central nervous system: Alert and oriented. No focal neurological deficits. Extremities: No cyanosis, clubbing or edema Skin: No rashes or ulcers Psychiatry:  Mood & affect appropriate.     Data Reviewed: I have personally reviewed following labs and imaging studies  CBC: Recent Labs  Lab 04/06/18 2217 04/07/18 0220 04/08/18 0451  WBC 6.8 7.2 9.6  NEUTROABS 4.2  --   --   HGB 10.0* 9.2*  9.4*  HCT 32.2* 29.5* 28.5*  MCV 91.7 93.1 89.6  PLT 126* 124* 102*   Basic Metabolic Panel: Recent Labs  Lab 04/06/18 2217 04/07/18 0220 04/08/18 0451  NA 136 138 136  K 4.6 4.6 5.3*  CL 109 112* 110  CO2 17* 14* 16*  GLUCOSE 92 130* 169*  BUN 32* 30* 41*  CREATININE 2.09* 2.04* 2.49*  CALCIUM 9.0 8.7* 9.3  MG  --  2.5*  --    GFR: Estimated Creatinine Clearance: 15.1 mL/min (A) (by C-G formula based on SCr of 2.49 mg/dL (H)). Liver Function Tests: No results for input(s): AST, ALT, ALKPHOS, BILITOT, PROT, ALBUMIN in the last 168 hours. No results for input(s): LIPASE, AMYLASE in the last 168 hours. No results for input(s): AMMONIA in the last 168 hours. Coagulation Profile: No results for input(s): INR, PROTIME  in the last 168 hours. Cardiac Enzymes: Recent Labs  Lab 04/06/18 2217  TROPONINI 0.05*   BNP (last 3 results) No results for input(s): PROBNP in the last 8760 hours. HbA1C: No results for input(s): HGBA1C in the last 72 hours. CBG: No results for input(s): GLUCAP in the last 168 hours. Lipid Profile: No results for input(s): CHOL, HDL, LDLCALC, TRIG, CHOLHDL, LDLDIRECT in the last 72 hours. Thyroid Function Tests: No results for input(s): TSH, T4TOTAL, FREET4, T3FREE, THYROIDAB in the last 72 hours. Anemia Panel: No results for input(s): VITAMINB12, FOLATE, FERRITIN, TIBC, IRON, RETICCTPCT in the last 72 hours. Urine analysis: No results found for: COLORURINE, APPEARANCEUR, LABSPEC, PHURINE, GLUCOSEU, HGBUR, BILIRUBINUR, KETONESUR, PROTEINUR, UROBILINOGEN, NITRITE, LEUKOCYTESUR Sepsis Labs: @LABRCNTIP (procalcitonin:4,lacticidven:4) )No results found for this or any previous visit (from the past 240 hour(s)).       Radiology Studies: Dg Chest 2 View  Result Date: 04/06/2018 CLINICAL DATA:  Initial evaluation for acute shortness of breath. History of COPD. EXAM: CHEST - 2 VIEW COMPARISON:  None available. FINDINGS: Cardiomegaly. Mediastinal silhouette within normal limits. Aortic atherosclerosis. Lungs hyperinflated with evidence for upper lobe predominant emphysema. Irregular pleuroparenchymal scarring at the bilateral lung apices, right greater than left. No focal infiltrates. No pulmonary edema or pleural effusion. No pneumothorax. No acute osseous abnormality.  Osteopenia noted. IMPRESSION: 1. COPD.  No superimposed active cardiopulmonary disease. 2. Cardiomegaly without pulmonary edema. 3. Aortic atherosclerosis. Electronically Signed   By: Jeannine Boga M.D.   On: 04/06/2018 21:44      Scheduled Meds: . aspirin EC  81 mg Oral Daily  . atorvastatin  80 mg Oral QPM  . azithromycin  250 mg Oral Daily  . calcitRIOL  0.5 mcg Oral Q MTWThF  . heparin  5,000 Units  Subcutaneous Q8H  . ipratropium-albuterol  3 mL Nebulization BID  . methylPREDNISolone (SOLU-MEDROL) injection  40 mg Intravenous Q12H  . metoprolol succinate  25 mg Oral Daily  . nicotine  7 mg Transdermal Daily  . pantoprazole  40 mg Oral Daily   Continuous Infusions:   LOS: 1 day      Debbe Odea, MD [facility] Pager: [email or web address] Password [facility] 04/08/2018, 3:35 PM

## 2018-04-08 NOTE — Progress Notes (Deleted)
PROGRESS NOTE    Maureen Mendez   CBJ:[id number]  DOB: [birth year]  DOA: 04/06/2018 PCP: Patient, No Pcp Per   Brief Narrative:  Maureen Mendez is an 85 y/o female who has moved here from [facility] to live with her daughter and comes in for cough and wheezing associated with dyspnea. She is out of her Albuterol inhaler.    Subjective: Still has a cough and shortness of breath.     Assessment & Plan:   Principal Problem:   COPD with acute exacerbation - cigarette smoker - is still wheezing today- cont IV steroids- will need script for inhaler as her refills are in [facility]-  - also has coarse crackles in left lung base although no radialogic evidence of pneumonia - start a Z pak (for COPD exacerbation) -cont Nebs - has been advised to quit smoking- cont Nicotine patch  Active Problems:   CAD (coronary artery disease) -cont ASA 81 mg, Toprol and Lipitor     CKD (chronic kidney disease), stage IV?  Metabolic acidosis - no prior labs to compare with- follow     Elevated troponin - noted to be 0.05 in ED- not repeated as she had no chest pain- likely was either   Demand ischemia or due to CKD  Anemia and mild thrombocytopenia - follow - again no old labs to compare with  Time spent in minutes: 35 DVT prophylaxis: Heparin Code Status: Full code Family Communication:  Disposition Plan: home - needs a local PCP Consultants:   none Procedures:   none Antimicrobials:  Anti-infectives (From admission, onward)   Start     Dose/Rate Route Frequency Ordered Stop   04/08/18 1000  azithromycin (ZITHROMAX) tablet 250 mg     250 mg Oral Daily 04/07/18 1632 04/12/18 0959   04/07/18 1700  azithromycin (ZITHROMAX) tablet 500 mg     500 mg Oral Daily 04/07/18 1632 04/07/18 1659       Objective: Vitals:   04/08/18 0351 04/08/18 0730 04/08/18 0930 04/08/18 1347  BP: (!) 155/94   (!) 136/93  Pulse: 77   74  Resp: 15   20  Temp: 97.9 F (36.6 C)   (!) 97.3 F (36.3 C)    TempSrc: Oral   Oral  SpO2: 92% 95% 95% 100%  Weight:      Height:        Intake/Output Summary (Last 24 hours) at 04/08/2018 1536 Last data filed at 04/07/2018 1808 Gross per 24 hour  Intake 180 ml  Output -  Net 180 ml   Filed Weights   04/06/18 2055 04/07/18 0808  Weight: 61.2 kg 59 kg    Examination: General exam: Appears comfortable  HEENT: PERRLA, oral mucosa moist, no sclera icterus or thrush Respiratory system: wheezing in left lung with crackles in Left lung base Cardiovascular system: S1 & S2 heard, RRR.   Gastrointestinal system: Abdomen soft, non-tender, nondistended. Normal bowel sounds. Central nervous system: Alert and oriented. No focal neurological deficits. Extremities: No cyanosis, clubbing or edema Skin: No rashes or ulcers Psychiatry:  Mood & affect appropriate.     Data Reviewed: I have personally reviewed following labs and imaging studies  CBC: Recent Labs  Lab 04/06/18 2217 04/07/18 0220 04/08/18 0451  WBC 6.8 7.2 9.6  NEUTROABS 4.2  --   --   HGB 10.0* 9.2* 9.4*  HCT 32.2* 29.5* 28.5*  MCV 91.7 93.1 89.6  PLT 126* 124* 160*   Basic Metabolic Panel: Recent Labs  Lab 04/06/18 2217 04/07/18  0220 04/08/18 0451  NA 136 138 136  K 4.6 4.6 5.3*  CL 109 112* 110  CO2 17* 14* 16*  GLUCOSE 92 130* 169*  BUN 32* 30* 41*  CREATININE 2.09* 2.04* 2.49*  CALCIUM 9.0 8.7* 9.3  MG  --  2.5*  --    GFR: Estimated Creatinine Clearance: 15.1 mL/min (A) (by C-G formula based on SCr of 2.49 mg/dL (H)). Liver Function Tests: No results for input(s): AST, ALT, ALKPHOS, BILITOT, PROT, ALBUMIN in the last 168 hours. No results for input(s): LIPASE, AMYLASE in the last 168 hours. No results for input(s): AMMONIA in the last 168 hours. Coagulation Profile: No results for input(s): INR, PROTIME in the last 168 hours. Cardiac Enzymes: Recent Labs  Lab 04/06/18 2217  TROPONINI 0.05*   BNP (last 3 results) No results for input(s): PROBNP in the  last 8760 hours. HbA1C: No results for input(s): HGBA1C in the last 72 hours. CBG: No results for input(s): GLUCAP in the last 168 hours. Lipid Profile: No results for input(s): CHOL, HDL, LDLCALC, TRIG, CHOLHDL, LDLDIRECT in the last 72 hours. Thyroid Function Tests: No results for input(s): TSH, T4TOTAL, FREET4, T3FREE, THYROIDAB in the last 72 hours. Anemia Panel: No results for input(s): VITAMINB12, FOLATE, FERRITIN, TIBC, IRON, RETICCTPCT in the last 72 hours. Urine analysis: No results found for: COLORURINE, APPEARANCEUR, LABSPEC, PHURINE, GLUCOSEU, HGBUR, BILIRUBINUR, KETONESUR, PROTEINUR, UROBILINOGEN, NITRITE, LEUKOCYTESUR Sepsis Labs: @LABRCNTIP (procalcitonin:4,lacticidven:4) )No results found for this or any previous visit (from the past 240 hour(s)).       Radiology Studies: Dg Chest 2 View  Result Date: 04/06/2018 CLINICAL DATA:  Initial evaluation for acute shortness of breath. History of COPD. EXAM: CHEST - 2 VIEW COMPARISON:  None available. FINDINGS: Cardiomegaly. Mediastinal silhouette within normal limits. Aortic atherosclerosis. Lungs hyperinflated with evidence for upper lobe predominant emphysema. Irregular pleuroparenchymal scarring at the bilateral lung apices, right greater than left. No focal infiltrates. No pulmonary edema or pleural effusion. No pneumothorax. No acute osseous abnormality.  Osteopenia noted. IMPRESSION: 1. COPD.  No superimposed active cardiopulmonary disease. 2. Cardiomegaly without pulmonary edema. 3. Aortic atherosclerosis. Electronically Signed   By: Jeannine Boga M.D.   On: 04/06/2018 21:44      Scheduled Meds: . aspirin EC  81 mg Oral Daily  . atorvastatin  80 mg Oral QPM  . azithromycin  250 mg Oral Daily  . calcitRIOL  0.5 mcg Oral Q MTWThF  . heparin  5,000 Units Subcutaneous Q8H  . ipratropium-albuterol  3 mL Nebulization BID  . methylPREDNISolone (SOLU-MEDROL) injection  40 mg Intravenous Q12H  . metoprolol succinate   25 mg Oral Daily  . nicotine  7 mg Transdermal Daily  . pantoprazole  40 mg Oral Daily   Continuous Infusions:   LOS: 1 day      Debbe Odea, MD [facility] Pager: [email or web address] Password [email or web address] 04/08/2018, 3:36 PM

## 2018-04-09 ENCOUNTER — Inpatient Hospital Stay (HOSPITAL_COMMUNITY): Payer: Medicare Other

## 2018-04-09 LAB — BASIC METABOLIC PANEL
Anion gap: 9 (ref 5–15)
BUN: 49 mg/dL — ABNORMAL HIGH (ref 8–23)
CHLORIDE: 113 mmol/L — AB (ref 98–111)
CO2: 16 mmol/L — ABNORMAL LOW (ref 22–32)
Calcium: 9.2 mg/dL (ref 8.9–10.3)
Creatinine, Ser: 2.35 mg/dL — ABNORMAL HIGH (ref 0.44–1.00)
GFR calc Af Amer: 21 mL/min — ABNORMAL LOW (ref >60)
GFR calc non Af Amer: 18 mL/min — ABNORMAL LOW (ref >60)
GLUCOSE: 127 mg/dL — AB (ref 70–99)
Potassium: 5.2 mmol/L — ABNORMAL HIGH (ref 3.5–5.1)
Sodium: 138 mmol/L (ref 135–145)

## 2018-04-09 MED ORDER — MUSCLE RUB 10-15 % EX CREA
1.0000 "application " | TOPICAL_CREAM | CUTANEOUS | Status: DC | PRN
  Administered 2018-04-09: 1 via TOPICAL
  Filled 2018-04-09: qty 85

## 2018-04-09 MED ORDER — ALBUTEROL SULFATE (2.5 MG/3ML) 0.083% IN NEBU: 2.5000 mg | INHALATION_SOLUTION | RESPIRATORY_TRACT | Status: DC | PRN

## 2018-04-09 MED ORDER — PATIROMER SORBITEX CALCIUM 8.4 G PO PACK
8.4000 g | PACK | Freq: Every day | ORAL | Status: DC
  Administered 2018-04-09 – 2018-04-10 (×2): 8.4 g via ORAL
  Filled 2018-04-09 (×2): qty 1

## 2018-04-09 MED ORDER — IPRATROPIUM-ALBUTEROL 0.5-2.5 (3) MG/3ML IN SOLN
3.0000 mL | Freq: Four times a day (QID) | RESPIRATORY_TRACT | Status: DC
  Administered 2018-04-09 (×2): 3 mL via RESPIRATORY_TRACT
  Filled 2018-04-09 (×2): qty 3

## 2018-04-09 MED ORDER — SODIUM CHLORIDE 0.9 % IV SOLN
1.0000 g | INTRAVENOUS | Status: DC
  Administered 2018-04-09 – 2018-04-10 (×2): 1 g via INTRAVENOUS
  Filled 2018-04-09 (×2): qty 10

## 2018-04-09 MED ORDER — SODIUM CHLORIDE 0.9 % IV BOLUS
500.0000 mL | Freq: Once | INTRAVENOUS | Status: AC
  Administered 2018-04-09: 500 mL via INTRAVENOUS

## 2018-04-09 NOTE — Progress Notes (Signed)
PROGRESS NOTE    Maureen Mendez   OYD:[id number]  DOB: 1929/03/31  DOA: 04/06/2018 PCP: Patient, No Pcp Per   Brief Narrative:  Maureen Mendez is an 85 y/o female who has moved here from [facility] to live with her daughter and comes in for cough and wheezing associated with dyspnea. She is out of her Albuterol inhaler.    Subjective: Still has cough.    Assessment & Plan:   Principal Problem:   COPD with acute exacerbation - cigarette smoker  RUL pneumonia - started a Z pak on 1/21 (for COPD exacerbation) - she continues to have wheezing today- repeat CXR suggestive or RUL infiltrate- will add Ceftriaxone -  - on 1/21> pulse ox in 80s when she ambulates- repeat pulse ox today pending - increase frequency of Nebs today - has been advised to quit smoking- cont Nicotine patch  Active Problems:   CAD (coronary artery disease) -cont ASA 81 mg, Toprol and Lipitor  Elevated BNP, enlarged heart on CXR, dyspnea and hypoxia on exertion -  No obvious CHF on exam or Xray- ECHO done- report pending  Mild hyperkalemia - continues to persist- start Veltessa and follow    CKD (chronic kidney disease), stage IV?  Metabolic acidosis - no prior labs to compare with- follow     Elevated troponin - noted to be 0.05 in ED- not repeated as she had no chest pain- likely was either   Demand ischemia or due to CKD  Anemia and mild thrombocytopenia - follow - again no old labs to compare with- platelets slightly better today  Time spent in minutes: 35 DVT prophylaxis: Heparin Code Status: Full code Family Communication:  Disposition Plan: home - needs a local PCP Consultants:   none Procedures:   none Antimicrobials:  Anti-infectives (From admission, onward)   Start     Dose/Rate Route Frequency Ordered Stop   04/09/18 0930  cefTRIAXone (ROCEPHIN) 1 g in sodium chloride 0.9 % 100 mL IVPB     1 g 200 mL/hr over 30 Minutes Intravenous Every 24 hours 04/09/18 0920     04/08/18 1000   azithromycin (ZITHROMAX) tablet 250 mg     250 mg Oral Daily 04/07/18 1632 04/12/18 0959   04/07/18 1700  azithromycin (ZITHROMAX) tablet 500 mg     500 mg Oral Daily 04/07/18 1632 04/07/18 1659       Objective: Vitals:   04/09/18 0831 04/09/18 1200 04/09/18 1437 04/09/18 1449  BP:   (!) 163/104   Pulse:   67   Resp:   20   Temp:   98.9 F (37.2 C)   TempSrc:   Oral   SpO2: 96% 97% 98% 92%  Weight:      Height:       No intake or output data in the 24 hours ending 04/09/18 1509 Filed Weights   04/06/18 2055 04/07/18 0808  Weight: 61.2 kg 59 kg    Examination: General exam: Appears comfortable  HEENT: PERRLA, oral mucosa moist, no sclera icterus or thrush Respiratory system: b/l wheezing and rhonchi Cardiovascular system: S1 & S2 heard, RRR.   Gastrointestinal system: Abdomen soft, non-tender, nondistended. Normal bowel sounds. Central nervous system: Alert and oriented. No focal neurological deficits. Extremities: No cyanosis, clubbing or edema Skin: No rashes or ulcers Psychiatry:  Mood & affect appropriate.    Data Reviewed: I have personally reviewed following labs and imaging studies  CBC: Recent Labs  Lab 04/06/18 2217 04/07/18 0220 04/08/18 0451  WBC 6.8  7.2 9.6  NEUTROABS 4.2  --   --   HGB 10.0* 9.2* 9.4*  HCT 32.2* 29.5* 28.5*  MCV 91.7 93.1 89.6  PLT 126* 124* 557*   Basic Metabolic Panel: Recent Labs  Lab 04/06/18 2217 04/07/18 0220 04/08/18 0451 04/09/18 0805  NA 136 138 136 138  K 4.6 4.6 5.3* 5.2*  CL 109 112* 110 113*  CO2 17* 14* 16* 16*  GLUCOSE 92 130* 169* 127*  BUN 32* 30* 41* 49*  CREATININE 2.09* 2.04* 2.49* 2.35*  CALCIUM 9.0 8.7* 9.3 9.2  MG  --  2.5*  --   --    GFR: Estimated Creatinine Clearance: 16 mL/min (A) (by C-G formula based on SCr of 2.35 mg/dL (H)). Liver Function Tests: No results for input(s): AST, ALT, ALKPHOS, BILITOT, PROT, ALBUMIN in the last 168 hours. No results for input(s): LIPASE, AMYLASE in the  last 168 hours. No results for input(s): AMMONIA in the last 168 hours. Coagulation Profile: No results for input(s): INR, PROTIME in the last 168 hours. Cardiac Enzymes: Recent Labs  Lab 04/06/18 2217  TROPONINI 0.05*   BNP (last 3 results) No results for input(s): PROBNP in the last 8760 hours. HbA1C: No results for input(s): HGBA1C in the last 72 hours. CBG: No results for input(s): GLUCAP in the last 168 hours. Lipid Profile: No results for input(s): CHOL, HDL, LDLCALC, TRIG, CHOLHDL, LDLDIRECT in the last 72 hours. Thyroid Function Tests: No results for input(s): TSH, T4TOTAL, FREET4, T3FREE, THYROIDAB in the last 72 hours. Anemia Panel: No results for input(s): VITAMINB12, FOLATE, FERRITIN, TIBC, IRON, RETICCTPCT in the last 72 hours. Urine analysis: No results found for: COLORURINE, APPEARANCEUR, LABSPEC, PHURINE, GLUCOSEU, HGBUR, BILIRUBINUR, KETONESUR, PROTEINUR, UROBILINOGEN, NITRITE, LEUKOCYTESUR Sepsis Labs: @LABRCNTIP (procalcitonin:4,lacticidven:4) )No results found for this or any previous visit (from the past 240 hour(s)).       Radiology Studies: Dg Chest Port 1 View  Result Date: 04/09/2018 CLINICAL DATA:  Respiratory crackles EXAM: PORTABLE CHEST 1 VIEW COMPARISON:  04/06/2018 FINDINGS: Mild increased interstitial markings in the left perihilar and right infrahilar regions. Mild right upper lobe opacity, suspicious for mild pneumonia. No definite pleural effusions. No pneumothorax. Cardiomegaly. IMPRESSION: Mild patchy right upper lobe opacity, suspicious for mild pneumonia. Electronically Signed   By: Julian Hy M.D.   On: 04/09/2018 08:10      Scheduled Meds: . aspirin EC  81 mg Oral Daily  . atorvastatin  80 mg Oral QPM  . azithromycin  250 mg Oral Daily  . calcitRIOL  0.5 mcg Oral Q MTWThF  . heparin  5,000 Units Subcutaneous Q8H  . ipratropium-albuterol  3 mL Nebulization Q6H  . methylPREDNISolone (SOLU-MEDROL) injection  40 mg Intravenous  Q12H  . metoprolol succinate  25 mg Oral Daily  . nicotine  7 mg Transdermal Daily  . pantoprazole  40 mg Oral Daily   Continuous Infusions: . cefTRIAXone (ROCEPHIN)  IV 1 g (04/09/18 1212)     LOS: 2 days      Debbe Odea, MD [facility] Pager: [email or web address] Password [facility] 04/09/2018, 3:09 PM

## 2018-04-09 NOTE — Progress Notes (Signed)
Pt informed that she needed to walk in order to see if she needs O2 for DC. Pt stated that she didn't feel like walking at this time and that she wanted to do it later. RN explained importance of ambulation and checking for the need for oxygen. Pt's O2 on RA at rest was 97%. RN attempted later in shift to ambulate Pt r/t MD needing to know. Pt stated she still didn't feel up to walking. RN once again explained importance of ambulation and monitoring O2 sats.

## 2018-04-10 DIAGNOSIS — N184 Chronic kidney disease, stage 4 (severe): Secondary | ICD-10-CM

## 2018-04-10 DIAGNOSIS — I5042 Chronic combined systolic (congestive) and diastolic (congestive) heart failure: Secondary | ICD-10-CM

## 2018-04-10 LAB — BASIC METABOLIC PANEL
Anion gap: 8 (ref 5–15)
BUN: 51 mg/dL — ABNORMAL HIGH (ref 8–23)
CHLORIDE: 110 mmol/L (ref 98–111)
CO2: 18 mmol/L — ABNORMAL LOW (ref 22–32)
Calcium: 9.2 mg/dL (ref 8.9–10.3)
Creatinine, Ser: 2.51 mg/dL — ABNORMAL HIGH (ref 0.44–1.00)
GFR calc Af Amer: 19 mL/min — ABNORMAL LOW (ref >60)
GFR calc non Af Amer: 17 mL/min — ABNORMAL LOW (ref >60)
Glucose, Bld: 138 mg/dL — ABNORMAL HIGH (ref 70–99)
Potassium: 5.1 mmol/L (ref 3.5–5.1)
Sodium: 136 mmol/L (ref 135–145)

## 2018-04-10 LAB — CBC
HCT: 29.8 % — ABNORMAL LOW (ref 36.0–46.0)
Hemoglobin: 9.4 g/dL — ABNORMAL LOW (ref 12.0–15.0)
MCH: 28.5 pg (ref 26.0–34.0)
MCHC: 31.5 g/dL (ref 30.0–36.0)
MCV: 90.3 fL (ref 80.0–100.0)
Platelets: 135 10*3/uL — ABNORMAL LOW (ref 150–400)
RBC: 3.3 MIL/uL — ABNORMAL LOW (ref 3.87–5.11)
RDW: 18.1 % — ABNORMAL HIGH (ref 11.5–15.5)
WBC: 9.2 10*3/uL (ref 4.0–10.5)
nRBC: 0.8 % — ABNORMAL HIGH (ref 0.0–0.2)

## 2018-04-10 MED ORDER — ACETAMINOPHEN 325 MG PO TABS: 650.0000 mg | ORAL_TABLET | Freq: Four times a day (QID) | ORAL | Status: AC | PRN

## 2018-04-10 MED ORDER — AZITHROMYCIN 250 MG PO TABS: 250.0000 mg | ORAL_TABLET | Freq: Every day | ORAL | 0 refills | Status: AC

## 2018-04-10 MED ORDER — ALBUTEROL SULFATE HFA 108 (90 BASE) MCG/ACT IN AERS: 2.0000 | INHALATION_SPRAY | RESPIRATORY_TRACT | 0 refills | Status: DC | PRN

## 2018-04-10 MED ORDER — PREDNISONE 20 MG PO TABS: 40.0000 mg | ORAL_TABLET | Freq: Every day | ORAL | 0 refills | Status: DC

## 2018-04-10 MED ORDER — ALBUTEROL SULFATE HFA 108 (90 BASE) MCG/ACT IN AERS: 2.0000 | INHALATION_SPRAY | Freq: Four times a day (QID) | RESPIRATORY_TRACT | 2 refills | Status: AC | PRN

## 2018-04-10 MED ORDER — GUAIFENESIN-DM 100-10 MG/5ML PO SYRP: 5.0000 mL | ORAL_SOLUTION | ORAL | 0 refills | Status: AC | PRN

## 2018-04-10 MED ORDER — NICOTINE 21 MG/24HR TD PT24: 21.0000 mg | MEDICATED_PATCH | Freq: Every day | TRANSDERMAL | Status: DC

## 2018-04-10 MED ORDER — NICOTINE 14 MG/24HR TD PT24: 14.0000 mg | MEDICATED_PATCH | Freq: Every day | TRANSDERMAL | 0 refills | Status: DC

## 2018-04-10 MED ORDER — IPRATROPIUM-ALBUTEROL 0.5-2.5 (3) MG/3ML IN SOLN
3.0000 mL | Freq: Three times a day (TID) | RESPIRATORY_TRACT | Status: DC
  Administered 2018-04-10 (×2): 3 mL via RESPIRATORY_TRACT
  Filled 2018-04-10 (×2): qty 3

## 2018-04-10 MED ORDER — NICOTINE 7 MG/24HR TD PT24: 7.0000 mg | MEDICATED_PATCH | Freq: Every day | TRANSDERMAL | 0 refills | Status: DC

## 2018-04-10 MED ORDER — GUAIFENESIN-DM 100-10 MG/5ML PO SYRP
5.0000 mL | ORAL_SOLUTION | ORAL | Status: DC | PRN
  Administered 2018-04-10: 5 mL via ORAL
  Filled 2018-04-10: qty 5

## 2018-04-10 MED ORDER — NICOTINE 21 MG/24HR TD PT24: 21.0000 mg | MEDICATED_PATCH | Freq: Every day | TRANSDERMAL | 0 refills | Status: DC

## 2018-04-10 MED ORDER — CEFUROXIME AXETIL 500 MG PO TABS: 500.0000 mg | ORAL_TABLET | Freq: Two times a day (BID) | ORAL | 0 refills | Status: AC

## 2018-04-10 MED ORDER — POLYETHYLENE GLYCOL 3350 17 G PO PACK: 17.0000 g | PACK | Freq: Every day | ORAL | 0 refills | Status: AC | PRN

## 2018-04-10 NOTE — Progress Notes (Signed)
SATURATION QUALIFICATIONS: (This note is used to comply with regulatory documentation for home oxygen)  Patient Saturations on Room Air at Rest = 98%  Patient Saturations on Room Air while Ambulating = 96%  Please briefly explain why patient needs home oxygen: N/A

## 2018-04-10 NOTE — Discharge Summary (Signed)
Physician Discharge Summary  Alle Difabio FTD:[id number] DOB: Mar 21, 1929 DOA: 04/06/2018  PCP: Patient, No Pcp Per  Admit date: 04/06/2018 Discharge date: 04/10/2018  Admitted From: home Disposition:  home   Recommendations for Outpatient Follow-up:  1. Needs to find local PCP- d/w daugther   Discharge Condition:  stable   CODE STATUS:  Full code   Diet recommendation:  Heart healthy Consultations:  none    Discharge Diagnoses:  Principal Problem:   COPD with acute exacerbation   Active Problems: CAP   CAD (coronary artery disease)   CVA (cerebral vascular accident) ([place])   Elevated troponin   CKD (chronic kidney disease) stage 4, GFR 15-29 ml/min (HCC) Chronic systolic and diastolic CHF  Brief Summary: Maureen Mendez is an 85 y/o female who has moved here from [facility] to live with her daughter and comes in for cough and wheezing associated with dyspnea. She is out of her Albuterol inhaler.   Hospital Course:  Principal Problem: Suspected COPD with acute exacerbation with pneumonia- cigarette smoker - she has been wheezing and hypoxic with ambulation- started on Z pak for COPD flare -   CXR repeated and subsequently showing RUL pneumonia- started on Ceftriaxone- wheezing improved and hypoxia resolved- would complete a course of a third generation cephalosporin and Z pak along with a short course of Prednisone - has been advised to quit smoking- smokes 2 ppd-  Nicotine patches ordered - refill for albuterol given - discussed plan with daughter today  Active Problems: Dementia - ? Poor memory in regards to events in the hospital- no acute delirium   Elevated BNP, enlarged heart on CXR, dyspnea and hypoxia on exertion -  obtained ECHO which shows an EF of 35-40%, Grade 2 d CHF and moderate RV dysfunction   - needs oupt follow up- d/w her daughter - has remained euvolemic in the hospital    CAD (coronary artery disease) -cont ASA 81 mg, Toprol and Lipitor  Mild  hyperkalemia - follow- recheck later today    CKD (chronic kidney disease), stage IV with Metabolic acidosis - no prior labs to compare with but daughter is aware that her mother has underlying renal issues      Elevated troponin - noted to be 0.05 in ED- not repeated as she had no chest pain- likely was either   Demand ischemia or due to CKD  Anemia and mild thrombocytopenia - appear chronic and are unchanged from admission    Discharge Exam: Vitals:   04/10/18 0954 04/10/18 1017  BP: (!) 160/91   Pulse: 75   Resp:    Temp: (!) 97.1 F (36.2 C)   SpO2: (!) 58% 98%   Vitals:   04/10/18 0544 04/10/18 0847 04/10/18 0954 04/10/18 1017  BP: (!) 167/102  (!) 160/91   Pulse: 66  75   Resp: 20     Temp: 97.7 F (36.5 C)  (!) 97.1 F (36.2 C)   TempSrc: Oral  Axillary   SpO2: 99% 100% (!) 58% 98%  Weight:      Height:        General: Pt is alert, awake, not in acute distress Cardiovascular: RRR, S1/S2 +, no rubs, no gallops Respiratory: CTA bilaterally, no wheezing, no rhonchi Abdominal: Soft, NT, ND, bowel sounds + Extremities: no edema, no cyanosis   Discharge Instructions  Discharge Instructions    Diet - low sodium heart healthy   Complete by:  As directed    Increase activity slowly   Complete by:  As directed      Allergies as of 04/10/2018   No Known Allergies     Medication List    TAKE these medications   acetaminophen 325 MG tablet Commonly known as:  TYLENOL Take 2 tablets (650 mg total) by mouth every 6 (six) hours as needed for mild pain (or Fever >/= 101).   albuterol 108 (90 Base) MCG/ACT inhaler Commonly known as:  PROVENTIL HFA;VENTOLIN HFA Inhale 2 puffs into the lungs every 6 (six) hours as needed for wheezing or shortness of breath. What changed:  You were already taking a medication with the same name, and this prescription was added. Make sure you understand how and when to take each.   albuterol 108 (90 Base) MCG/ACT  inhaler Commonly known as:  PROVENTIL HFA;VENTOLIN HFA Inhale 2 puffs into the lungs every 4 (four) hours as needed for wheezing or shortness of breath. What changed:  Another medication with the same name was added. Make sure you understand how and when to take each.   aspirin EC 81 MG tablet Take 81 mg by mouth daily.   atorvastatin 80 MG tablet Commonly known as:  LIPITOR Take 80 mg by mouth every evening.   azithromycin 250 MG tablet Commonly known as:  ZITHROMAX Take 1 tablet (250 mg total) by mouth daily for 2 days. Start taking on:  April 11, 2018   calcitRIOL 0.5 MCG capsule Commonly known as:  ROCALTROL Take 0.5 mcg by mouth See admin instructions. Monday-Friday   cefUROXime 500 MG tablet Commonly known as:  CEFTIN Take 1 tablet (500 mg total) by mouth 2 (two) times daily for 5 days.   diclofenac sodium 1 % Gel Commonly known as:  VOLTAREN Apply 2 g topically 4 (four) times daily as needed (pain).   docusate sodium 100 MG capsule Commonly known as:  COLACE Take 100 mg by mouth 2 (two) times daily as needed for mild constipation.   guaiFENesin-dextromethorphan 100-10 MG/5ML syrup Commonly known as:  ROBITUSSIN DM Take 5 mLs by mouth every 4 (four) hours as needed for cough (chest congestion).   hydrOXYzine 10 MG tablet Commonly known as:  ATARAX/VISTARIL Take 10 mg by mouth 3 (three) times daily as needed for anxiety.   metoprolol succinate 25 MG 24 hr tablet Commonly known as:  TOPROL-XL Take 25 mg by mouth daily.   omeprazole 20 MG capsule Commonly known as:  PRILOSEC Take 20 mg by mouth daily.   polyethylene glycol packet Commonly known as:  MIRALAX / GLYCOLAX Take 17 g by mouth daily as needed for mild constipation.   potassium chloride 10 MEQ tablet Commonly known as:  K-DUR,KLOR-CON Take 10 mEq by mouth every evening.   predniSONE 20 MG tablet Commonly known as:  DELTASONE Take 2 tablets (40 mg total) by mouth daily.       No Known  Allergies   Procedures/Studies:  2 D ECHO Study Conclusions  - Left ventricle: The cavity size was normal. There was severe   concentric hypertrophy. Systolic function was moderately reduced.   The estimated ejection fraction was in the range of 35% to 40%.   Diffuse hypokinesis. Doppler parameters are consistent with   pseudonormal left ventricular relaxation (grade 2 diastolic   dysfunction). The E/e&' ratio is >15, suggesting elevated LV   filling pressure. - Mitral valve: Mildly thickened leaflets . There was mild   regurgitation. - Left atrium: Severely dilated. - Right ventricle: The cavity size was mildly dilated. Moderately   reduced  systolic function. - Right atrium: Severely dilated. - Tricuspid valve: There was mild regurgitation. - Pulmonary arteries: PA peak pressure: 45 mm Hg (S). - Inferior vena cava: The vessel was dilated. The respirophasic   diameter changes were blunted (< 50%), consistent with elevated   central venous pressure.  Impressions:  - LVEF 35-40%, severe LVH, global hypokinesis, grade 2 DD, elevated   LV filling pressure, mild MR, severe LAE, mildly dilated RV with   moderate systolic dysfunction, severe RAE, mild TR, RVSP 45 mmHg,   dilated IVC.   Dg Chest 2 View  Result Date: 04/06/2018 CLINICAL DATA:  Initial evaluation for acute shortness of breath. History of COPD. EXAM: CHEST - 2 VIEW COMPARISON:  None available. FINDINGS: Cardiomegaly. Mediastinal silhouette within normal limits. Aortic atherosclerosis. Lungs hyperinflated with evidence for upper lobe predominant emphysema. Irregular pleuroparenchymal scarring at the bilateral lung apices, right greater than left. No focal infiltrates. No pulmonary edema or pleural effusion. No pneumothorax. No acute osseous abnormality.  Osteopenia noted. IMPRESSION: 1. COPD.  No superimposed active cardiopulmonary disease. 2. Cardiomegaly without pulmonary edema. 3. Aortic atherosclerosis.  Electronically Signed   By: Jeannine Boga M.D.   On: 04/06/2018 21:44   Dg Chest Port 1 View  Result Date: 04/09/2018 CLINICAL DATA:  Respiratory crackles EXAM: PORTABLE CHEST 1 VIEW COMPARISON:  04/06/2018 FINDINGS: Mild increased interstitial markings in the left perihilar and right infrahilar regions. Mild right upper lobe opacity, suspicious for mild pneumonia. No definite pleural effusions. No pneumothorax. Cardiomegaly. IMPRESSION: Mild patchy right upper lobe opacity, suspicious for mild pneumonia. Electronically Signed   By: Julian Hy M.D.   On: 04/09/2018 08:10     The results of significant diagnostics from this hospitalization (including imaging, microbiology, ancillary and laboratory) are listed below for reference.     Microbiology: No results found for this or any previous visit (from the past 240 hour(s)).   Labs: BNP (last 3 results) Recent Labs    04/06/18 2217  BNP 4,332.9*   Basic Metabolic Panel: Recent Labs  Lab 04/06/18 2217 04/07/18 0220 04/08/18 0451 04/09/18 0805 04/10/18 0422  NA 136 138 136 138 136  K 4.6 4.6 5.3* 5.2* 5.1  CL 109 112* 110 113* 110  CO2 17* 14* 16* 16* 18*  GLUCOSE 92 130* 169* 127* 138*  BUN 32* 30* 41* 49* 51*  CREATININE 2.09* 2.04* 2.49* 2.35* 2.51*  CALCIUM 9.0 8.7* 9.3 9.2 9.2  MG  --  2.5*  --   --   --    Liver Function Tests: No results for input(s): AST, ALT, ALKPHOS, BILITOT, PROT, ALBUMIN in the last 168 hours. No results for input(s): LIPASE, AMYLASE in the last 168 hours. No results for input(s): AMMONIA in the last 168 hours. CBC: Recent Labs  Lab 04/06/18 2217 04/07/18 0220 04/08/18 0451 04/10/18 0422  WBC 6.8 7.2 9.6 9.2  NEUTROABS 4.2  --   --   --   HGB 10.0* 9.2* 9.4* 9.4*  HCT 32.2* 29.5* 28.5* 29.8*  MCV 91.7 93.1 89.6 90.3  PLT 126* 124* 142* 135*   Cardiac Enzymes: Recent Labs  Lab 04/06/18 2217  TROPONINI 0.05*   BNP: Invalid input(s): POCBNP CBG: No results for  input(s): GLUCAP in the last 168 hours. D-Dimer No results for input(s): DDIMER in the last 72 hours. Hgb A1c No results for input(s): HGBA1C in the last 72 hours. Lipid Profile No results for input(s): CHOL, HDL, LDLCALC, TRIG, CHOLHDL, LDLDIRECT in the last 72 hours. Thyroid  function studies No results for input(s): TSH, T4TOTAL, T3FREE, THYROIDAB in the last 72 hours.  Invalid input(s): FREET3 Anemia work up No results for input(s): VITAMINB12, FOLATE, FERRITIN, TIBC, IRON, RETICCTPCT in the last 72 hours. Urinalysis No results found for: COLORURINE, APPEARANCEUR, LABSPEC, [place], GLUCOSEU, HGBUR, BILIRUBINUR, KETONESUR, PROTEINUR, UROBILINOGEN, NITRITE, LEUKOCYTESUR Sepsis Labs Invalid input(s): PROCALCITONIN,  WBC,  LACTICIDVEN Microbiology No results found for this or any previous visit (from the past 240 hour(s)).   Time coordinating discharge in minutes: 65  SIGNED:   Debbe Odea, MD  [facility] 04/10/2018, 12:59 PM Pager   If 7PM-7AM, please contact night-coverage [email or web address] Password [email or web address]

## 2018-04-10 NOTE — Care Management Important Message (Signed)
Important Message  Patient Details  Name: Maureen Mendez MRN: [id number] Date of Birth: 1930-02-11   Medicare Important Message Given:  Yes    Corina Stacy Montine Circle 04/10/2018, 3:04 PM

## 2018-04-10 NOTE — Progress Notes (Signed)
Pt given discharge instructions, prescriptions, and care notes. Pt verbalized understanding AEB no further questions or concerns at this time. IV was discontinued, no redness, pain, or swelling noted at this time. Pt left the floor via wheelchair with staff in stable condition. 

## 2018-05-07 ENCOUNTER — Encounter (HOSPITAL_COMMUNITY): Payer: Self-pay | Admitting: *Deleted

## 2018-05-07 ENCOUNTER — Other Ambulatory Visit: Payer: Self-pay

## 2018-05-07 ENCOUNTER — Inpatient Hospital Stay (HOSPITAL_COMMUNITY)
Admission: EM | Admit: 2018-05-07 | Discharge: 2018-05-16 | DRG: 291 | Disposition: A | Discharge status: Hospice | Payer: Medicare Other | Attending: Internal Medicine | Admitting: Internal Medicine

## 2018-05-07 ENCOUNTER — Emergency Department (HOSPITAL_COMMUNITY): Payer: Medicare Other

## 2018-05-07 DIAGNOSIS — D696 Thrombocytopenia, unspecified: Secondary | ICD-10-CM | POA: Diagnosis present

## 2018-05-07 DIAGNOSIS — R042 Hemoptysis: Secondary | ICD-10-CM | POA: Diagnosis not present

## 2018-05-07 DIAGNOSIS — J440 Chronic obstructive pulmonary disease with acute lower respiratory infection: Secondary | ICD-10-CM | POA: Diagnosis not present

## 2018-05-07 DIAGNOSIS — I509 Heart failure, unspecified: Secondary | ICD-10-CM

## 2018-05-07 DIAGNOSIS — I5022 Chronic systolic (congestive) heart failure: Secondary | ICD-10-CM | POA: Diagnosis present

## 2018-05-07 DIAGNOSIS — R7989 Other specified abnormal findings of blood chemistry: Secondary | ICD-10-CM

## 2018-05-07 DIAGNOSIS — I639 Cerebral infarction, unspecified: Secondary | ICD-10-CM | POA: Diagnosis present

## 2018-05-07 DIAGNOSIS — R945 Abnormal results of liver function studies: Secondary | ICD-10-CM

## 2018-05-07 DIAGNOSIS — Z7189 Other specified counseling: Secondary | ICD-10-CM

## 2018-05-07 DIAGNOSIS — E875 Hyperkalemia: Secondary | ICD-10-CM | POA: Diagnosis present

## 2018-05-07 DIAGNOSIS — J189 Pneumonia, unspecified organism: Secondary | ICD-10-CM | POA: Diagnosis not present

## 2018-05-07 DIAGNOSIS — Z515 Encounter for palliative care: Secondary | ICD-10-CM

## 2018-05-07 DIAGNOSIS — J9601 Acute respiratory failure with hypoxia: Secondary | ICD-10-CM | POA: Diagnosis present

## 2018-05-07 DIAGNOSIS — N184 Chronic kidney disease, stage 4 (severe): Secondary | ICD-10-CM | POA: Diagnosis present

## 2018-05-07 DIAGNOSIS — Z9119 Patient's noncompliance with other medical treatment and regimen: Secondary | ICD-10-CM

## 2018-05-07 DIAGNOSIS — R778 Other specified abnormalities of plasma proteins: Secondary | ICD-10-CM

## 2018-05-07 DIAGNOSIS — E7800 Pure hypercholesterolemia, unspecified: Secondary | ICD-10-CM | POA: Diagnosis present

## 2018-05-07 DIAGNOSIS — J449 Chronic obstructive pulmonary disease, unspecified: Secondary | ICD-10-CM | POA: Diagnosis present

## 2018-05-07 DIAGNOSIS — E872 Acidosis: Secondary | ICD-10-CM | POA: Diagnosis present

## 2018-05-07 DIAGNOSIS — I2510 Atherosclerotic heart disease of native coronary artery without angina pectoris: Secondary | ICD-10-CM | POA: Diagnosis present

## 2018-05-07 DIAGNOSIS — G9341 Metabolic encephalopathy: Secondary | ICD-10-CM | POA: Diagnosis present

## 2018-05-07 DIAGNOSIS — R0902 Hypoxemia: Secondary | ICD-10-CM

## 2018-05-07 DIAGNOSIS — Z7901 Long term (current) use of anticoagulants: Secondary | ICD-10-CM

## 2018-05-07 DIAGNOSIS — J441 Chronic obstructive pulmonary disease with (acute) exacerbation: Secondary | ICD-10-CM | POA: Diagnosis present

## 2018-05-07 DIAGNOSIS — I248 Other forms of acute ischemic heart disease: Secondary | ICD-10-CM | POA: Diagnosis present

## 2018-05-07 DIAGNOSIS — Z7982 Long term (current) use of aspirin: Secondary | ICD-10-CM

## 2018-05-07 DIAGNOSIS — Z681 Body mass index (BMI) 19 or less, adult: Secondary | ICD-10-CM

## 2018-05-07 DIAGNOSIS — I2729 Other secondary pulmonary hypertension: Secondary | ICD-10-CM | POA: Diagnosis present

## 2018-05-07 DIAGNOSIS — I5023 Acute on chronic systolic (congestive) heart failure: Secondary | ICD-10-CM

## 2018-05-07 DIAGNOSIS — F17200 Nicotine dependence, unspecified, uncomplicated: Secondary | ICD-10-CM | POA: Diagnosis present

## 2018-05-07 DIAGNOSIS — Z8673 Personal history of transient ischemic attack (TIA), and cerebral infarction without residual deficits: Secondary | ICD-10-CM

## 2018-05-07 DIAGNOSIS — I130 Hypertensive heart and chronic kidney disease with heart failure and stage 1 through stage 4 chronic kidney disease, or unspecified chronic kidney disease: Secondary | ICD-10-CM | POA: Diagnosis not present

## 2018-05-07 DIAGNOSIS — J9621 Acute and chronic respiratory failure with hypoxia: Secondary | ICD-10-CM | POA: Diagnosis present

## 2018-05-07 DIAGNOSIS — I5043 Acute on chronic combined systolic (congestive) and diastolic (congestive) heart failure: Secondary | ICD-10-CM | POA: Diagnosis present

## 2018-05-07 DIAGNOSIS — I480 Paroxysmal atrial fibrillation: Secondary | ICD-10-CM | POA: Diagnosis present

## 2018-05-07 DIAGNOSIS — Z9981 Dependence on supplemental oxygen: Secondary | ICD-10-CM

## 2018-05-07 DIAGNOSIS — R0602 Shortness of breath: Secondary | ICD-10-CM

## 2018-05-07 DIAGNOSIS — F17210 Nicotine dependence, cigarettes, uncomplicated: Secondary | ICD-10-CM | POA: Diagnosis present

## 2018-05-07 DIAGNOSIS — E43 Unspecified severe protein-calorie malnutrition: Secondary | ICD-10-CM | POA: Diagnosis present

## 2018-05-07 DIAGNOSIS — E785 Hyperlipidemia, unspecified: Secondary | ICD-10-CM | POA: Diagnosis present

## 2018-05-07 DIAGNOSIS — Z66 Do not resuscitate: Secondary | ICD-10-CM | POA: Diagnosis not present

## 2018-05-07 DIAGNOSIS — K7290 Hepatic failure, unspecified without coma: Secondary | ICD-10-CM | POA: Diagnosis present

## 2018-05-07 DIAGNOSIS — I4892 Unspecified atrial flutter: Secondary | ICD-10-CM | POA: Diagnosis present

## 2018-05-07 DIAGNOSIS — K219 Gastro-esophageal reflux disease without esophagitis: Secondary | ICD-10-CM | POA: Diagnosis present

## 2018-05-07 DIAGNOSIS — F0390 Unspecified dementia without behavioral disturbance: Secondary | ICD-10-CM | POA: Diagnosis present

## 2018-05-07 LAB — URINALYSIS, ROUTINE W REFLEX MICROSCOPIC
BILIRUBIN URINE: NEGATIVE
Glucose, UA: NEGATIVE mg/dL
Ketones, ur: NEGATIVE mg/dL
LEUKOCYTE UA: NEGATIVE
Nitrite: NEGATIVE
PH: 5 (ref 5.0–8.0)
Protein, ur: 30 mg/dL — AB
Specific Gravity, Urine: 1.013 (ref 1.005–1.030)

## 2018-05-07 LAB — CBC
HCT: 39.2 % (ref 36.0–46.0)
Hemoglobin: 12 g/dL (ref 12.0–15.0)
MCH: 29.3 pg (ref 26.0–34.0)
MCHC: 30.6 g/dL (ref 30.0–36.0)
MCV: 95.6 fL (ref 80.0–100.0)
Platelets: 91 10*3/uL — ABNORMAL LOW (ref 150–400)
RBC: 4.1 MIL/uL (ref 3.87–5.11)
RDW: 23.3 % — ABNORMAL HIGH (ref 11.5–15.5)
WBC: 8.1 10*3/uL (ref 4.0–10.5)
nRBC: 8.6 % — ABNORMAL HIGH (ref 0.0–0.2)

## 2018-05-07 LAB — BASIC METABOLIC PANEL
Anion gap: 15 (ref 5–15)
BUN: 47 mg/dL — ABNORMAL HIGH (ref 8–23)
CO2: 10 mmol/L — ABNORMAL LOW (ref 22–32)
Calcium: 9.2 mg/dL (ref 8.9–10.3)
Chloride: 114 mmol/L — ABNORMAL HIGH (ref 98–111)
Creatinine, Ser: 2.63 mg/dL — ABNORMAL HIGH (ref 0.44–1.00)
GFR, EST AFRICAN AMERICAN: 18 mL/min — AB (ref >60)
GFR, EST NON AFRICAN AMERICAN: 16 mL/min — AB (ref >60)
Glucose, Bld: 85 mg/dL (ref 70–99)
Potassium: 5.5 mmol/L — ABNORMAL HIGH (ref 3.5–5.1)
Sodium: 139 mmol/L (ref 135–145)

## 2018-05-07 LAB — I-STAT TROPONIN, ED: Troponin i, poc: 0.12 ng/mL (ref 0.00–0.08)

## 2018-05-07 LAB — BRAIN NATRIURETIC PEPTIDE: B Natriuretic Peptide: 3324.2 pg/mL — ABNORMAL HIGH (ref 0.0–100.0)

## 2018-05-07 LAB — TROPONIN I: Troponin I: 0.1 ng/mL (ref <0.03)

## 2018-05-07 MED ORDER — HYDROXYZINE HCL 10 MG PO TABS
10.0000 mg | ORAL_TABLET | Freq: Three times a day (TID) | ORAL | Status: DC | PRN
  Filled 2018-05-07 (×2): qty 1

## 2018-05-07 MED ORDER — ATORVASTATIN CALCIUM 80 MG PO TABS
80.0000 mg | ORAL_TABLET | Freq: Every evening | ORAL | Status: DC
  Administered 2018-05-07 – 2018-05-15 (×8): 80 mg via ORAL
  Filled 2018-05-07 (×11): qty 1

## 2018-05-07 MED ORDER — FUROSEMIDE 10 MG/ML IJ SOLN
20.0000 mg | Freq: Two times a day (BID) | INTRAMUSCULAR | Status: DC
  Administered 2018-05-07 – 2018-05-08 (×3): 20 mg via INTRAVENOUS
  Filled 2018-05-07 (×2): qty 2
  Filled 2018-05-07: qty 4

## 2018-05-07 MED ORDER — DOCUSATE SODIUM 100 MG PO CAPS: 100.0000 mg | ORAL_CAPSULE | Freq: Two times a day (BID) | ORAL | Status: DC | PRN

## 2018-05-07 MED ORDER — HYDRALAZINE HCL 20 MG/ML IJ SOLN
5.0000 mg | Freq: Once | INTRAMUSCULAR | Status: AC
  Administered 2018-05-07: 5 mg via INTRAVENOUS
  Filled 2018-05-07: qty 1

## 2018-05-07 MED ORDER — IPRATROPIUM-ALBUTEROL 0.5-2.5 (3) MG/3ML IN SOLN
3.0000 mL | Freq: Four times a day (QID) | RESPIRATORY_TRACT | Status: DC
  Administered 2018-05-07 – 2018-05-08 (×2): 3 mL via RESPIRATORY_TRACT
  Filled 2018-05-07 (×3): qty 3

## 2018-05-07 MED ORDER — POLYETHYLENE GLYCOL 3350 17 G PO PACK: 17.0000 g | PACK | Freq: Every day | ORAL | Status: DC | PRN

## 2018-05-07 MED ORDER — METOPROLOL SUCCINATE ER 25 MG PO TB24
25.0000 mg | ORAL_TABLET | Freq: Every day | ORAL | Status: DC
  Administered 2018-05-07 – 2018-05-08 (×2): 25 mg via ORAL
  Filled 2018-05-07 (×2): qty 1

## 2018-05-07 MED ORDER — APIXABAN 2.5 MG PO TABS
2.5000 mg | ORAL_TABLET | Freq: Two times a day (BID) | ORAL | Status: DC
  Administered 2018-05-07 – 2018-05-16 (×18): 2.5 mg via ORAL
  Filled 2018-05-07 (×18): qty 1

## 2018-05-07 MED ORDER — ENSURE ENLIVE PO LIQD
237.0000 mL | Freq: Two times a day (BID) | ORAL | Status: DC
  Administered 2018-05-08 – 2018-05-16 (×14): 237 mL via ORAL

## 2018-05-07 MED ORDER — PANTOPRAZOLE SODIUM 40 MG PO TBEC
40.0000 mg | DELAYED_RELEASE_TABLET | Freq: Every day | ORAL | Status: DC
  Administered 2018-05-07 – 2018-05-16 (×10): 40 mg via ORAL
  Filled 2018-05-07 (×10): qty 1

## 2018-05-07 MED ORDER — ACETAMINOPHEN 325 MG PO TABS
325.0000 mg | ORAL_TABLET | Freq: Four times a day (QID) | ORAL | Status: DC | PRN
  Administered 2018-05-08 – 2018-05-15 (×3): 325 mg via ORAL
  Filled 2018-05-07 (×3): qty 1

## 2018-05-07 MED ORDER — IPRATROPIUM-ALBUTEROL 0.5-2.5 (3) MG/3ML IN SOLN
3.0000 mL | Freq: Three times a day (TID) | RESPIRATORY_TRACT | Status: DC
  Administered 2018-05-08: 3 mL via RESPIRATORY_TRACT
  Filled 2018-05-07: qty 3

## 2018-05-07 MED ORDER — ASPIRIN EC 81 MG PO TBEC
81.0000 mg | DELAYED_RELEASE_TABLET | Freq: Every day | ORAL | Status: DC
  Administered 2018-05-07 – 2018-05-16 (×10): 81 mg via ORAL
  Filled 2018-05-07 (×10): qty 1

## 2018-05-07 MED ORDER — ALBUTEROL SULFATE (2.5 MG/3ML) 0.083% IN NEBU: 2.5000 mg | INHALATION_SOLUTION | RESPIRATORY_TRACT | Status: DC | PRN

## 2018-05-07 MED ORDER — SODIUM CHLORIDE 0.9% FLUSH
3.0000 mL | Freq: Once | INTRAVENOUS | Status: AC
  Administered 2018-05-07: 3 mL via INTRAVENOUS

## 2018-05-07 MED ORDER — NICOTINE 14 MG/24HR TD PT24
14.0000 mg | MEDICATED_PATCH | Freq: Every day | TRANSDERMAL | Status: DC
  Administered 2018-05-07 – 2018-05-16 (×10): 14 mg via TRANSDERMAL
  Filled 2018-05-07 (×10): qty 1

## 2018-05-07 MED ORDER — CALCITRIOL 0.5 MCG PO CAPS
0.5000 ug | ORAL_CAPSULE | ORAL | Status: DC
  Administered 2018-05-08 – 2018-05-16 (×6): 0.5 ug via ORAL
  Filled 2018-05-07 (×7): qty 1

## 2018-05-07 MED ORDER — ONDANSETRON 4 MG PO TBDP
4.0000 mg | ORAL_TABLET | Freq: Once | ORAL | Status: AC
  Administered 2018-05-07: 4 mg via ORAL
  Filled 2018-05-07: qty 1

## 2018-05-07 MED ORDER — GUAIFENESIN-DM 100-10 MG/5ML PO SYRP
5.0000 mL | ORAL_SOLUTION | ORAL | Status: DC | PRN
  Administered 2018-05-10 – 2018-05-14 (×2): 5 mL via ORAL
  Filled 2018-05-07 (×2): qty 5

## 2018-05-07 NOTE — H&P (Signed)
History and Physical    Maureen Mendez FKC:[id number] DOB: March 04, 1930 DOA: 05/07/2018  PCP: Patient, No Pcp Per  Patient coming from: home   I have personally briefly reviewed patient's old medical records available.   Chief Complaint: shortness of breath   HPI: Maureen Mendez is a 85 y.o. female with medical history significant of chronic kidney disease stage IV, recent multiple territory stroke on Eliquis, history of congestive heart failure with known ejection fraction of 35%, smoker, COPD, GERD, paroxysmal A. fib, poor health maintenance who presents to the emergency room with shortness of breath for 3 days.  Patient was living at [facility] about 2 months ago then her daughter brought her to live with her at [place].  Patient was admitted last month in our hospital with COPD exacerbation and treated with bronchodilators and antibiotics with some clinical improvement.  Patient apparently on Eliquis since last 3 to 4 years, reviewed her neurologist report from last year.  Being on Eliquis was not mentioned in last hospitalization.  According to the patient's daughter, she is not motivated.  Patient usually does not keep up with medications.  She did somehow better after discharge last month, however last 3 days she has been increasingly short of breath.  Patient was wheezing all the time.  She cannot get up and walk without getting dyspneic.  Dry cough.  No fever.  No sick contacts.  No flulike symptoms.  Poor appetite. ED Course: Hemodynamically stable.  Blood pressures elevated rather.  Potassium 5.5.  Creatinine 2.63 which is at about baseline.  Troponin borderline elevated.  Twelve-lead EKG is nonischemic.  Chest x-ray shows chronic changes.  proBNP is more than 3000.  Patient with inspiratory and expiratory wheezes.  Review of Systems: As per HPI otherwise 10 point review of systems negative.    Past Medical History:  Diagnosis Date  . Asthma   . CHF (congestive heart failure) ([place])   . COPD  (chronic obstructive pulmonary disease) ([place])   . GERD (gastroesophageal reflux disease)   . High cholesterol   . Renal disorder     Past Surgical History:  Procedure Laterality Date  . ABDOMINAL HYSTERECTOMY       reports that she has been smoking cigarettes. She has been smoking about 1.00 pack per day. She has never used smokeless tobacco. She reports previous alcohol use. She reports previous drug use.  No Known Allergies  History reviewed. No pertinent family history.   Prior to Admission medications   Medication Sig Start Date End Date Taking? Authorizing Provider  acetaminophen (TYLENOL) 325 MG tablet Take 2 tablets (650 mg total) by mouth every 6 (six) hours as needed for mild pain (or Fever >/= 101). Patient taking differently: Take 325 mg by mouth every 6 (six) hours as needed for mild pain (or Fever >/= 101).  04/10/18  Yes Rizwan, Eunice Blase, MD  albuterol (PROVENTIL HFA;VENTOLIN HFA) 108 (90 Base) MCG/ACT inhaler Inhale 2 puffs into the lungs every 6 (six) hours as needed for wheezing or shortness of breath. 04/10/18  Yes Debbe Odea, MD  aspirin EC 81 MG tablet Take 81 mg by mouth daily.   Yes [provider]  atorvastatin (LIPITOR) 80 MG tablet Take 80 mg by mouth every evening.   Yes [provider]  calcitRIOL (ROCALTROL) 0.5 MCG capsule Take 0.5 mcg by mouth See admin instructions. Monday-Friday   Yes [provider]  diclofenac sodium (VOLTAREN) 1 % GEL Apply 2 g topically 4 (four) times daily as needed (  pain).   Yes [provider]  docusate sodium (COLACE) 100 MG capsule Take 100 mg by mouth 2 (two) times daily as needed for mild constipation.   Yes [provider]  ELIQUIS 2.5 MG TABS tablet Take 2.5 mg by mouth daily. 02/19/18  Yes [provider]  guaiFENesin-dextromethorphan (ROBITUSSIN DM) 100-10 MG/5ML syrup Take 5 mLs by mouth every 4 (four) hours as needed for cough (chest congestion). 04/10/18  Yes Debbe Odea, MD  hydrOXYzine (ATARAX/VISTARIL) 10 MG tablet Take 10 mg by mouth 3 (three) times daily as needed for anxiety.   Yes [provider]  metoprolol succinate (TOPROL-XL) 25 MG 24 hr tablet Take 25 mg by mouth daily.   Yes [provider]  omeprazole (PRILOSEC) 20 MG capsule Take 20 mg by mouth daily.   Yes [provider]  polyethylene glycol (MIRALAX / GLYCOLAX) packet Take 17 g by mouth daily as needed for mild constipation. 04/10/18  Yes Debbe Odea, MD  potassium chloride (K-DUR,KLOR-CON) 10 MEQ tablet Take 10 mEq by mouth every evening.   Yes [provider]    Physical Exam: Vitals:   05/07/18 0930 05/07/18 1000 05/07/18 1030 05/07/18 1100  BP: (!) 162/136 (!) 168/126 (!) 170/120 (!) 171/153  Pulse: (!) 105 98 90 98  Resp: (!) 22 18 (!) 23 19  Temp:      TempSrc:      SpO2: 98% 95% 93% 95%  Weight:      Height:        Constitutional: NAD, calm, comfortable Vitals:   05/07/18 0930 05/07/18 1000 05/07/18 1030 05/07/18 1100  BP: (!) 162/136 (!) 168/126 (!) 170/120 (!) 171/153  Pulse: (!) 105 98 90 98  Resp: (!) 22 18 (!) 23 19  Temp:      TempSrc:      SpO2: 98% 95% 93% 95%  Weight:      Height:       Eyes: PERRL, lids and conjunctivae normal Chronically sick looking.  Not in any distress. ENMT: Mucous membranes are moist. Posterior pharynx clear of any exudate or lesions.Normal dentition.  Neck: normal, supple, no masses, no thyromegaly Respiratory: Bilateral poor air entry, expiratory and inspiratory wheezes.  Presence of basal crackles.  Normal respiratory effort. No accessory muscle use.  Cardiovascular: Regular rate and rhythm, no murmurs / rubs / gallops. No extremity edema. 2+ pedal pulses. No carotid bruits.  Abdomen: no tenderness, no masses palpated. No hepatosplenomegaly. Bowel sounds positive.  Musculoskeletal: no clubbing / cyanosis. No joint deformity upper and lower extremities. Good ROM, no contractures. Normal  muscle tone.  Skin: no rashes, lesions, ulcers. No induration Neurologic: CN 2-12 grossly intact. Sensation intact, DTR normal. Strength generalized weakness 4/5 in all 4.  Psychiatric: Normal judgment and insight. Alert and oriented x 3.  Anxious.    Labs on Admission: I have personally reviewed following labs and imaging studies  CBC: Recent Labs  Lab 05/07/18 0857  WBC 8.1  HGB 12.0  HCT 39.2  MCV 95.6  PLT 91*   Basic Metabolic Panel: Recent Labs  Lab 05/07/18 0857  NA 139  K 5.5*  CL 114*  CO2 10*  GLUCOSE 85  BUN 47*  CREATININE 2.63*  CALCIUM 9.2   GFR: Estimated Creatinine Clearance: 11.5 mL/min (A) (by C-G formula based on SCr of 2.63 mg/dL (H)). Liver Function Tests: No results for input(s): AST, ALT, ALKPHOS, BILITOT, PROT, ALBUMIN in the last 168 hours. No results for input(s): LIPASE, AMYLASE  in the last 168 hours. No results for input(s): AMMONIA in the last 168 hours. Coagulation Profile: No results for input(s): INR, PROTIME in the last 168 hours. Cardiac Enzymes: No results for input(s): CKTOTAL, CKMB, CKMBINDEX, TROPONINI in the last 168 hours. BNP (last 3 results) No results for input(s): PROBNP in the last 8760 hours. HbA1C: No results for input(s): HGBA1C in the last 72 hours. CBG: No results for input(s): GLUCAP in the last 168 hours. Lipid Profile: No results for input(s): CHOL, HDL, LDLCALC, TRIG, CHOLHDL, LDLDIRECT in the last 72 hours. Thyroid Function Tests: No results for input(s): TSH, T4TOTAL, FREET4, T3FREE, THYROIDAB in the last 72 hours. Anemia Panel: No results for input(s): VITAMINB12, FOLATE, FERRITIN, TIBC, IRON, RETICCTPCT in the last 72 hours. Urine analysis: No results found for: COLORURINE, APPEARANCEUR, LABSPEC, PHURINE, GLUCOSEU, HGBUR, BILIRUBINUR, KETONESUR, PROTEINUR, UROBILINOGEN, NITRITE, LEUKOCYTESUR  Radiological Exams on Admission: Dg Chest 2 View  Result Date: 05/07/2018 CLINICAL DATA:  Shortness of  breath.  Renal failure EXAM: CHEST - 2 VIEW COMPARISON:  April 09, 2018 FINDINGS: There is cardiomegaly with mild pulmonary venous hypertension. There are areas of scarring in the upper lobes. There is slight interstitial edema. There are small pleural effusions bilaterally. No consolidation. There is aortic atherosclerosis. Bones are osteoporotic. IMPRESSION: Pulmonary vascular congestion with mild interstitial edema and small pleural effusions. No consolidation. Foci of scarring in the upper lobes, stable. Aortic Atherosclerosis (ICD10-I70.0). Electronically Signed   By: Lowella Grip III M.D.   On: 05/07/2018 07:37    EKG: Independently reviewed.  Twelve-lead EKG shows A. fib with RVR, heart rate of 107.  No acute ischemic changes.  Assessment/Plan Principal Problem:   Shortness of breath Active Problems:   CAD (coronary artery disease)   CVA (cerebral vascular accident) ([place])   Elevated troponin   CKD (chronic kidney disease) stage 4, GFR 15-29 ml/min (HCC)   Chronic systolic CHF (congestive heart failure) (HCC)   Smoker   Paroxysmal A-fib ([place])     1.  Shortness of breath: Multifactorial. Patient may have some component of acute on chronic congestive heart failure.  Will treat with IV Lasix to optimize, however clinically patient looks euvolemic.  Daily weight.  CHF education.  Low-salt diet.  Known ejection fraction of 30%, recent echocardiogram available, no need to repeat echocardiogram.  Patient is on beta-blockers that she will continue.  Not on ACE inhibitors because of worsening renal functions. COPD: Patient has significant component of COPD and bronchospasm.  Will treat with aggressive bronchodilator therapy.  Cough and deep breathing exercises.  Incentive spirometry.  Do not have evidence of infection.  2.  Hyperkalemia: Due to potassium supplement.  Discontinue.  3.  Paroxysmal A. fib: Rate fairly controlled.  Resume Eliquis.  Home medications as Eliquis 2.5 mg once a  day that is not adequate anticoagulation.  Reviewed her previous hospitalization and outpatient records, she should be on Eliquis 2.5 mg twice a day.  4.  Chronic kidney disease stage IV: More or less at baseline.  We will continue to monitor on diuretics.  5.  Elevated troponins: Borderline elevated troponin suspect type II non-STEMI.  Will repeat levels to ensure stabilization.  6.  Smoker: Patient continues to smoke.  Counseled to quit his smoking.  7.  Physical deconditioning: She needs to work with PT OT.  Consult PT OT.  Patient's daughter at the bedside.  Patient was moved to live with her.  Her daughter is anticipating aggressive treatment as well as dialysis if  needed.  DVT prophylaxis: On Eliquis. Code Status: Full code. Family Communication: Daughter at the bedside. Disposition Plan: Skilled nursing facility. Consults called: None. Admission status: Observation cardiac telemetry.   Barb Merino MD [facility] Pager [phone]  If 7PM-7AM, please contact night-coverage [email or web address] Password [facility]  05/07/2018, 11:09 AM

## 2018-05-07 NOTE — ED Notes (Signed)
Purewick placed on pt. 

## 2018-05-07 NOTE — ED Triage Notes (Signed)
Pt in c/o shortness of breath for the last few days, recent pneumonia, also being treated for CHF and stage 4 renal disease, speaking in full sentences and denies pain, family reports patient is not complaint with medications

## 2018-05-07 NOTE — ED Provider Notes (Signed)
[facility] EMERGENCY DEPARTMENT Provider Note   CSN: [id number] Arrival date & time: 05/07/18  0704    History   Chief Complaint Chief Complaint  Patient presents with  . Shortness of Breath    HPI Maureen Mendez is a 85 y.o. female with PMH significant for COPD, HFrEF, CKD4, CAD    Has been having SOB for the past 3 days. Also with cough productive of brownish sputum. Daughter states the SOB is worse at night particularly with laying down and has needed to be propped up. No chest pain or palpitations. No fever/chills. Was recently treated for pneumonia last month and completed her antibiotics. She has needed more assistance ambulating due to SOB, having difficulty even getting to bedside commode.      Past Medical History:  Diagnosis Date  . Asthma   . CHF (congestive heart failure) ([place])   . COPD (chronic obstructive pulmonary disease) ([place])   . GERD (gastroesophageal reflux disease)   . High cholesterol   . Renal disorder     Patient Active Problem List   Diagnosis Date Noted  . CKD (chronic kidney disease) stage 4, GFR 15-29 ml/min (HCC) 04/10/2018  . COPD with acute exacerbation ([place]) 04/06/2018  . CAD (coronary artery disease) 04/06/2018  . CVA (cerebral vascular accident) ([place]) 04/06/2018  . Elevated troponin 04/06/2018    Past Surgical History:  Procedure Laterality Date  . ABDOMINAL HYSTERECTOMY       OB History   No obstetric history on file.      Home Medications    Prior to Admission medications   Medication Sig Start Date End Date Taking? Authorizing Provider  acetaminophen (TYLENOL) 325 MG tablet Take 2 tablets (650 mg total) by mouth every 6 (six) hours as needed for mild pain (or Fever >/= 101). 04/10/18   Debbe Odea, MD  albuterol (PROVENTIL HFA;VENTOLIN HFA) 108 (90 Base) MCG/ACT inhaler Inhale 2 puffs into the lungs every 6 (six) hours as needed for wheezing or shortness of breath. 04/10/18   Debbe Odea, MD  albuterol  (PROVENTIL HFA;VENTOLIN HFA) 108 (90 Base) MCG/ACT inhaler Inhale 2 puffs into the lungs every 4 (four) hours as needed for wheezing or shortness of breath. 04/10/18   Debbe Odea, MD  aspirin EC 81 MG tablet Take 81 mg by mouth daily.    [provider]  atorvastatin (LIPITOR) 80 MG tablet Take 80 mg by mouth every evening.    [provider]  calcitRIOL (ROCALTROL) 0.5 MCG capsule Take 0.5 mcg by mouth See admin instructions. Monday-Friday    [provider]  diclofenac sodium (VOLTAREN) 1 % GEL Apply 2 g topically 4 (four) times daily as needed (pain).    [provider]  docusate sodium (COLACE) 100 MG capsule Take 100 mg by mouth 2 (two) times daily as needed for mild constipation.    [provider]  guaiFENesin-dextromethorphan (ROBITUSSIN DM) 100-10 MG/5ML syrup Take 5 mLs by mouth every 4 (four) hours as needed for cough (chest congestion). 04/10/18   Debbe Odea, MD  hydrOXYzine (ATARAX/VISTARIL) 10 MG tablet Take 10 mg by mouth 3 (three) times daily as needed for anxiety.    [provider]  metoprolol succinate (TOPROL-XL) 25 MG 24 hr tablet Take 25 mg by mouth daily.    [provider]  nicotine (NICODERM CQ - DOSED IN MG/24 HOURS) 21 mg/24hr patch Place 1 patch (21 mg total) onto the skin daily. 04/10/18   Debbe Odea, MD  nicotine (  NICODERM CQ) 14 mg/24hr patch Place 1 patch (14 mg total) onto the skin daily. Start when the 21 mg patches are finished 04/10/18   Debbe Odea, MD  nicotine (NICODERM CQ) 7 mg/24hr patch Place 1 patch (7 mg total) onto the skin daily. Start after 14 mg patches are finished. 04/10/18   Debbe Odea, MD  omeprazole (PRILOSEC) 20 MG capsule Take 20 mg by mouth daily.    [provider]  polyethylene glycol (MIRALAX / GLYCOLAX) packet Take 17 g by mouth daily as needed for mild constipation. 04/10/18   Debbe Odea, MD  potassium chloride (K-DUR,KLOR-CON) 10 MEQ tablet Take 10 mEq by  mouth every evening.    [provider]  predniSONE (DELTASONE) 20 MG tablet Take 2 tablets (40 mg total) by mouth daily. 04/10/18   Debbe Odea, MD    Family History History reviewed. No pertinent family history.  Social History Social History   Tobacco Use  . Smoking status: Current Every Day Smoker    Packs/day: 1.00    Types: Cigarettes  . Smokeless tobacco: Never Used  Substance Use Topics  . Alcohol use: Not Currently  . Drug use: Not Currently     Allergies   Patient has no known allergies.   Review of Systems Review of Systems  Constitutional: Negative for chills and fever.  HENT: Negative for congestion and sore throat.   Respiratory: Positive for cough and shortness of breath. Negative for wheezing.        Orthopnea  Cardiovascular: Negative for chest pain and palpitations.  Gastrointestinal: Negative for abdominal pain, diarrhea, nausea and vomiting.  Genitourinary: Negative for difficulty urinating and dysuria.     Physical Exam Updated Vital Signs BP (!) 169/136   Pulse 98   Temp 97.7 F (36.5 C) (Oral)   Resp 20   Ht 5\' 3"  (1.6 m)   Wt 47.6 kg   SpO2 98%   BMI 18.60 kg/m   Physical Exam Constitutional:      General: She is not in acute distress.    Appearance: She is not ill-appearing, toxic-appearing or diaphoretic.  Eyes:     Extraocular Movements: Extraocular movements intact.     Pupils: Pupils are equal, round, and reactive to light.  Cardiovascular:     Rate and Rhythm: Normal rate and regular rhythm.     Comments: + JVD Pulmonary:     Effort: Pulmonary effort is normal. No respiratory distress.     Breath sounds: No stridor. Wheezing (slight inspiratory wheezes at upper fields) and rales (crackles at bilateral lung bases) present.  Musculoskeletal:     Comments: Trace pitting edema in both legs to mid calf  Skin:    General: Skin is warm and dry.  Neurological:     General: No focal deficit present.     Mental Status:  She is alert.      ED Treatments / Results  Labs (all labs ordered are listed, but only abnormal results are displayed) Labs Reviewed  BASIC METABOLIC PANEL - Abnormal; Notable for the following components:      Result Value   Potassium 5.5 (*)    Chloride 114 (*)    CO2 10 (*)    BUN 47 (*)    Creatinine, Ser 2.63 (*)    GFR calc non Af Amer 16 (*)    GFR calc Af Amer 18 (*)    All other components within normal limits  CBC - Abnormal; Notable for the following components:  RDW 23.3 (*)    nRBC 8.6 (*)    All other components within normal limits  BRAIN NATRIURETIC PEPTIDE - Abnormal; Notable for the following components:   B Natriuretic Peptide 3,324.2 (*)    All other components within normal limits  I-STAT TROPONIN, ED - Abnormal; Notable for the following components:   Troponin i, poc 0.12 (*)    All other components within normal limits  URINALYSIS, ROUTINE W REFLEX MICROSCOPIC    EKG None  Radiology Dg Chest 2 View  Result Date: 05/07/2018 CLINICAL DATA:  Shortness of breath.  Renal failure EXAM: CHEST - 2 VIEW COMPARISON:  April 09, 2018 FINDINGS: There is cardiomegaly with mild pulmonary venous hypertension. There are areas of scarring in the upper lobes. There is slight interstitial edema. There are small pleural effusions bilaterally. No consolidation. There is aortic atherosclerosis. Bones are osteoporotic. IMPRESSION: Pulmonary vascular congestion with mild interstitial edema and small pleural effusions. No consolidation. Foci of scarring in the upper lobes, stable. Aortic Atherosclerosis (ICD10-I70.0). Electronically Signed   By: Lowella Grip III M.D.   On: 05/07/2018 07:37    Procedures Procedures (including critical care time)  Medications Ordered in ED Medications  sodium chloride flush (NS) 0.9 % injection 3 mL (3 mLs Intravenous Given 05/07/18 0858)     Initial Impression / Assessment and Plan / ED Course  I have reviewed the triage vital  signs and the nursing notes.  Pertinent labs & imaging results that were available during my care of the patient were reviewed by me and considered in my medical decision making (see chart for details).      Patient is here with SOB with orthopnea. She is afebrile and well appearing. She has JVD and crackles on lung exam. BNP is 3324 and CXR showing edema with small pleural effusions. Her istat trop is increased from previous without chest pain so may be due to demand, no EKG changes. Her family at bedside is concerned with her generalized deconditioning and general inability to care for her at home at this time. Discussed with [facility] hospitalist who will admit patient.  Final Clinical Impressions(s) / ED Diagnoses   Final diagnoses:  Acute on chronic systolic congestive heart failure (HCC)  Elevated troponin    ED Discharge Orders    None       Bufford Lope, DO 05/07/18 [place], DO 05/07/18 1027

## 2018-05-08 DIAGNOSIS — R0602 Shortness of breath: Secondary | ICD-10-CM | POA: Diagnosis not present

## 2018-05-08 DIAGNOSIS — J9601 Acute respiratory failure with hypoxia: Secondary | ICD-10-CM | POA: Diagnosis not present

## 2018-05-08 DIAGNOSIS — I5022 Chronic systolic (congestive) heart failure: Secondary | ICD-10-CM | POA: Diagnosis not present

## 2018-05-08 DIAGNOSIS — I5023 Acute on chronic systolic (congestive) heart failure: Secondary | ICD-10-CM | POA: Diagnosis not present

## 2018-05-08 DIAGNOSIS — F17200 Nicotine dependence, unspecified, uncomplicated: Secondary | ICD-10-CM

## 2018-05-08 LAB — TROPONIN I
Troponin I: 0.11 ng/mL (ref <0.03)
Troponin I: 0.1 ng/mL (ref <0.03)

## 2018-05-08 LAB — GLUCOSE, CAPILLARY: Glucose-Capillary: 167 mg/dL — ABNORMAL HIGH (ref 70–99)

## 2018-05-08 LAB — CBC
HCT: 39.7 % (ref 36.0–46.0)
Hemoglobin: 12.7 g/dL (ref 12.0–15.0)
MCH: 29.4 pg (ref 26.0–34.0)
MCHC: 32 g/dL (ref 30.0–36.0)
MCV: 91.9 fL (ref 80.0–100.0)
NRBC: 9.8 % — AB (ref 0.0–0.2)
Platelets: 100 10*3/uL — ABNORMAL LOW (ref 150–400)
RBC: 4.32 MIL/uL (ref 3.87–5.11)
RDW: 22.9 % — ABNORMAL HIGH (ref 11.5–15.5)
WBC: 9.5 10*3/uL (ref 4.0–10.5)

## 2018-05-08 LAB — BASIC METABOLIC PANEL
Anion gap: 14 (ref 5–15)
BUN: 50 mg/dL — ABNORMAL HIGH (ref 8–23)
CO2: 12 mmol/L — ABNORMAL LOW (ref 22–32)
Calcium: 8.5 mg/dL — ABNORMAL LOW (ref 8.9–10.3)
Chloride: 112 mmol/L — ABNORMAL HIGH (ref 98–111)
Creatinine, Ser: 2.72 mg/dL — ABNORMAL HIGH (ref 0.44–1.00)
GFR calc Af Amer: 18 mL/min — ABNORMAL LOW (ref >60)
GFR calc non Af Amer: 15 mL/min — ABNORMAL LOW (ref >60)
Glucose, Bld: 88 mg/dL (ref 70–99)
Potassium: 5 mmol/L (ref 3.5–5.1)
SODIUM: 138 mmol/L (ref 135–145)

## 2018-05-08 MED ORDER — DICLOFENAC SODIUM 1 % TD GEL
2.0000 g | Freq: Four times a day (QID) | TRANSDERMAL | Status: DC | PRN
  Filled 2018-05-08: qty 100

## 2018-05-08 MED ORDER — ORAL CARE MOUTH RINSE
15.0000 mL | Freq: Two times a day (BID) | OROMUCOSAL | Status: DC
  Administered 2018-05-08 – 2018-05-16 (×11): 15 mL via OROMUCOSAL

## 2018-05-08 MED ORDER — POLYVINYL ALCOHOL 1.4 % OP SOLN
1.0000 [drp] | Freq: Four times a day (QID) | OPHTHALMIC | Status: DC | PRN
  Administered 2018-05-09 – 2018-05-10 (×3): 1 [drp] via OPHTHALMIC
  Filled 2018-05-08: qty 15

## 2018-05-08 MED ORDER — LEVALBUTEROL HCL 0.63 MG/3ML IN NEBU: 0.6300 mg | INHALATION_SOLUTION | Freq: Four times a day (QID) | RESPIRATORY_TRACT | Status: DC | PRN

## 2018-05-08 MED ORDER — HYDRALAZINE HCL 20 MG/ML IJ SOLN
10.0000 mg | Freq: Once | INTRAMUSCULAR | Status: AC
  Administered 2018-05-08: 10 mg via INTRAVENOUS
  Filled 2018-05-08: qty 1

## 2018-05-08 MED ORDER — IPRATROPIUM BROMIDE 0.02 % IN SOLN: 0.5000 mg | Freq: Four times a day (QID) | RESPIRATORY_TRACT | Status: DC | PRN

## 2018-05-08 MED ORDER — ADULT MULTIVITAMIN W/MINERALS CH
1.0000 | ORAL_TABLET | Freq: Every day | ORAL | Status: DC
  Administered 2018-05-08 – 2018-05-16 (×9): 1 via ORAL
  Filled 2018-05-08 (×9): qty 1

## 2018-05-08 MED ORDER — HYPROMELLOSE (GONIOSCOPIC) 2.5 % OP SOLN
1.0000 [drp] | Freq: Four times a day (QID) | OPHTHALMIC | Status: DC | PRN
  Filled 2018-05-08: qty 15

## 2018-05-08 MED ORDER — METOPROLOL TARTRATE 25 MG PO TABS
25.0000 mg | ORAL_TABLET | Freq: Four times a day (QID) | ORAL | Status: DC
  Administered 2018-05-08 – 2018-05-16 (×26): 25 mg via ORAL
  Filled 2018-05-08 (×30): qty 1

## 2018-05-08 MED ORDER — FUROSEMIDE 10 MG/ML IJ SOLN
80.0000 mg | Freq: Once | INTRAMUSCULAR | Status: AC
  Administered 2018-05-08: 80 mg via INTRAVENOUS
  Filled 2018-05-08: qty 8

## 2018-05-08 NOTE — Progress Notes (Signed)
OT Cancellation Note  Patient Details Name: Maureen Mendez MRN: [id number] DOB: 07/12/1929   Cancelled Treatment:    Reason Eval/Treat Not Completed: Medical issues which prohibited therapy. Pt HR elevated to 130's at rest. Nurse asked therapy  to HOLD this am, will check back later as able/appropriate.   Emmit Alexanders G [facility] 05/08/2018, 11:12 AM

## 2018-05-08 NOTE — Care Management Obs Status (Signed)
[facility] NOTIFICATION   Patient Details  Name: Maureen Mendez MRN: [id number] Date of Birth: 1929/11/21   Medicare Observation Status Notification Given:  Yes    Carles Collet, RN 05/08/2018, 10:40 AM

## 2018-05-08 NOTE — Evaluation (Signed)
Physical Therapy Evaluation Patient Details Name: Maureen Mendez MRN: [id number] DOB: [birth year] Today's Date: 05/08/2018   History of Present Illness  Pt is an 85 y.o. female admitted 05/07/18 with SOB; worked up for CHF exacerbation. PMH includes CKD IV, stroke, CHF, COPD, asthma, afib. Of note, recent admission for COPD.     Clinical Impression  Pt presents with an overall decrease in functional mobility secondary to above. Pt seems confused, poor historian with minimal verbalizations; reports mod indep and lives with daughter who works. Today, pt declining OOB mobility secondary to fatigue. Pt unable to hold phone to face during phone conversation, requiring assist for this. Limited by lethargy and generalized weakness. HR 107-120 during session. Pt would benefit from continued acute PT services to maximize functional mobility and independence prior to d/c with SNF-level therapies.     Follow Up Recommendations SNF;Supervision for mobility/OOB    Equipment Recommendations  (TBD)    Recommendations for Other Services       Precautions / Restrictions Precautions Precautions: Fall Restrictions Weight Bearing Restrictions: No      Mobility  Bed Mobility Overal bed mobility: Needs Assistance             General bed mobility comments: Indep to roll onto R-side; pt declining sitting upright  Transfers                 General transfer comment: NT  Ambulation/Gait                Stairs            Wheelchair Mobility    Modified Rankin (Stroke Patients Only)       Balance                                             Pertinent Vitals/Pain Pain Assessment: Faces Faces Pain Scale: No hurt    Home Living Family/patient expects to be discharged to:: Private residence Living Arrangements: Children Available Help at Discharge: Family;Available PRN/intermittently             Additional Comments: Poor historian regarding home  set-up. Reports daughter works during day    Prior Function           Comments: Pt poor historian. Reports mod indep with RW, drives     Hand Dominance        Extremity/Trunk Assessment   Upper Extremity Assessment Upper Extremity Assessment: Generalized weakness(Not holding phone to ear despite assist to bring hand to phone)    Lower Extremity Assessment Lower Extremity Assessment: Generalized weakness(Gross observation at least 3/5 in knee flex/ext)       Communication   Communication: Expressive difficulties  Cognition Arousal/Alertness: Lethargic Behavior During Therapy: Flat affect Overall Cognitive Status: No family/caregiver present to determine baseline cognitive functioning                                 General Comments: Slow to follow commands, minimal answers to questions, not holding phone to ear when talking      General Comments      Exercises     Assessment/Plan    PT Assessment Patient needs continued PT services  PT Problem List Decreased strength;Decreased activity tolerance;Decreased balance;Decreased mobility;Decreased cognition;Decreased knowledge of use of DME;Cardiopulmonary status limiting activity  PT Treatment Interventions DME instruction;Gait training;Functional mobility training;Therapeutic activities;Therapeutic exercise;Balance training;Patient/family education    PT Goals (Current goals can be found in the Care Plan section)  Acute Rehab PT Goals Patient Stated Goal: None stated PT Goal Formulation: With patient Time For Goal Achievement: 05/22/18    Frequency Min 2X/week   Barriers to discharge Decreased caregiver support      Co-evaluation               AM-PAC PT "6 Clicks" Mobility  Outcome Measure Help needed turning from your back to your side while in a flat bed without using bedrails?: A Little Help needed moving from lying on your back to sitting on the side of a flat bed without  using bedrails?: A Lot Help needed moving to and from a bed to a chair (including a wheelchair)?: A Lot Help needed standing up from a chair using your arms (e.g., wheelchair or bedside chair)?: A Lot Help needed to walk in hospital room?: A Lot Help needed climbing 3-5 steps with a railing? : Total 6 Click Score: 12    End of Session Equipment Utilized During Treatment: Oxygen Activity Tolerance: Patient limited by fatigue;Patient limited by lethargy Patient left: in bed;with call bell/phone within reach;with bed alarm set Nurse Communication: Mobility status PT Visit Diagnosis: Other abnormalities of gait and mobility (R26.89);Muscle weakness (generalized) (M62.81)    Time: 7425-9563 PT Time Calculation (min) (ACUTE ONLY): 16 min   Charges:   PT Evaluation $PT Eval Moderate Complexity: [place], PT, DPT Acute Rehabilitation Services  Pager [phone] Office [place] 05/08/2018, 4:49 PM

## 2018-05-08 NOTE — Progress Notes (Signed)
   Vital Signs MEWS/VS Documentation      05/08/2018 0917 05/08/2018 1028 05/08/2018 1308 05/08/2018 1425   MEWS Score:  1  1  3  2    MEWS Score Color:  Green  Green  Yellow  Yellow   Pulse:  -  -  (!) 131  (!) 118   BP:  -  -  (!) 104/91  -   Temp:  (!) 97.5 F (36.4 C)  -  98.7 F (37.1 C)  -   Level of Consciousness:  -  Alert  -  -           Tristan Schroeder 05/08/2018,2:26 PM

## 2018-05-08 NOTE — Care Management Note (Signed)
Case Management Note  Patient Details  Name: Maureen Mendez MRN: [id number] Date of Birth: 21-Aug-1929  Subjective/Objective:     Shortness of Breath              Action/Plan: Patient lives at home with daughter/ son in law; PCP Dr Grier Mitts - new; follow up hosp apt  Is Feb 27,2020 at 2:30 pm; has private insurance with [facility] with prescription drug coverage; pharmacy of choice is [facility]; DME - walker, cane and wheelchair at home; Daughter states that the DCP is to return home at discharge; [facility] choice offered, she chose [place]; Cory with [facility] called for arrangements; CM will continue to follow for progression of care.  Expected Discharge Date:   possibly 05/12/2018               Expected Discharge Plan:  [place]  Discharge planning Services  CM Consult  Choice offered to:  Adult Children  HH Arranged:  RN, PT, OT, Nurse's Aide [place] Agency:  [facility]  Status of Service:  In process, will continue to follow  Sherrilyn Rist [phone] 05/08/2018, 12:59 PM

## 2018-05-08 NOTE — Progress Notes (Signed)
Spoke w patient's daughter Donella Stade on the phone. SHe states her mom lives with her and they plan on her returning home at DC. 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 She would like [facility] services also, list from Medicare w quality ratings left in room. She states her mom has DME 3/1 shower seat and RW at home. PT eval pending. Cleatrice Burke RN CM.

## 2018-05-08 NOTE — Progress Notes (Addendum)
Progress Note    Delmi Fulfer  JGG:[id number] DOB: 11-Feb-1930  DOA: 05/07/2018 PCP: Patient, No Pcp Per    Brief Narrative:   Chief complaint: Shortness of breath  Medical records reviewed and are as summarized below:  Antonietta Lansdowne is an 85 y.o. female with pmh of combined systolic and diastolic CHF with last EF 35 to 40% with grade 2 diastolic dysfunction, PAF, tobacco abuse, COPD, CKD stage IV, and history of CVAs; who presents with shortness of breath.  Assessment/Plan:   Principal Problem:   Shortness of breath Active Problems:   CAD (coronary artery disease)   CVA (cerebral vascular accident) ([place])   Elevated troponin   CKD (chronic kidney disease) stage 4, GFR 15-29 ml/min (HCC)   Chronic systolic CHF (congestive heart failure) (HCC)   Smoker   Paroxysmal A-fib (HCC)  Acute respiratory failure with hypoxia 2/2 combined systolic and diastolic congestive heart failure exacerbation: Patient requiring nasal cannula oxygen to maintain O2 saturations.  Patient not normally on nasal cannula oxygen.  Physical exam reveals crackles.  Chest x-ray showing signs of pulmonary edema and BNP elevated at 3324.2.  Last known ejection fraction of 35-40% with grade 2 diastolic dysfunction recently obtain during previous admission at last month for suspected COPD exacerbation.  Weight appears down 2 pounds. -Continuous pulse oximetry and wean nasal cannula oxygen as tolerated to room air -Strict I&Os and daily weights -Continue Lasix 20 mg IV twice daily until evaluated by cardiology and will adjust dose according -Appreciate cardiology consultative services, will follow-up for further recommendation  COPD exacerbation: Patient has significant component of COPD and bronchospasm.  Will treat with aggressive bronchodilator therapy.  Cough and deep breathing exercises.  Incentive spirometry.  Do not have evidence of infection. -Substitute of levalbuterol for albuterol  Elevated troponins:  Acute.  Troponins 0.1-> 0.11-> 0.1. Suspect secondary to type II demand given kidney disease.  Paroxysmal A. fib: Heart rates into the 130s, but question breathing treatments. Patient currently treated with Eliquis. -Continue Eliquis and metoprolol  Hyperkalemia: Resolved  Chronic kidney disease stage IV:  Creatinine slowly trending upwards with diuresis to 2.72 -Daily BMP   Essential hypertension -Continue metoprolol and diuretics  History of CVA -Continue aspirin and Eliquis  Dyslipidemia: -Continue atorvastatin  Tobacco abuse: Patient continues to smoke.  Counseled to quit his smoking. -Nicotine patch   Thrombocytopenia: Acute on chronic.  No reports of bleeding  Physical deconditioning: Patient appears globally weak on exam.  Plan for skilled nursing facility placement -Follow-up PT and OT   Body mass index is 21.56 kg/m.   Family Communication/Anticipated D/C date and plan/Code Status   DVT prophylaxis: Eliquis Code Status: Full Code.  Family Communication: No family present at bedside Disposition Plan: SNF   Medical Consultants:    Cardiology   Anti-Infectives:    None  Subjective:   Patient states that she drinks a lot of water at nighttime because she wakes up thirsty.  Denies having any significant fever or chills.  Objective:    Vitals:   05/08/18 1633 05/08/18 1819 05/08/18 2000 05/09/18 0044  BP:  123/90 94/76 96/68   Pulse: (!) 109 95 80 97  Resp:   20 18  Temp:   (!) 97.3 F (36.3 C) (!) 97.4 F (36.3 C)  TempSrc:   Oral Oral  SpO2: 98% 96% 94% 92%  Weight:      Height:        Intake/Output Summary (Last 24 hours) at 05/09/2018 0407  Last data filed at 05/09/2018 0054 Gross per 24 hour  Intake 290 ml  Output 851 ml  Net -561 ml   Filed Weights   05/07/18 2778 05/07/18 2034 05/08/18 0520  Weight: 47.6 kg 56 kg 55.2 kg    Exam: Constitutional: Frail elderly female  NAD, calm, comfortable Eyes: Right eyelid close and I  appears to be opacified when opened. ENMT: Mucous membranes are moist. Posterior pharynx clear of any exudate or lesions. Hard of hearing.   Neck: normal, supple, no masses, no thyromegaly.  No signs of JVD. Respiratory: Crackles appreciated in the mid and lower lung fields.  Patient on 2 L of oxygen currently to maintain O2 saturations. Cardiovascular: Regular rate and rhythm, no murmurs / rubs / gallops.  Trace lower extremity edema. 2+ pedal pulses. No carotid bruits.  Abdomen: no tenderness, no masses palpated. No hepatosplenomegaly. Bowel sounds positive.  Musculoskeletal: no clubbing / cyanosis. No joint deformity upper and lower extremities. Good ROM, no contractures. Normal muscle tone.  Skin: no rashes, lesions, ulcers. No induration Neurologic: CN 2-12 grossly intact. Sensation intact, DTR normal. Strength 5/5 in all 4.  Psychiatric: Normal judgment and insight. Alert and oriented x 3. Normal mood.    Data Reviewed:   I have personally reviewed following labs and imaging studies:  Labs: Labs show the following:   Basic Metabolic Panel: Recent Labs  Lab 05/07/18 0857 05/08/18 0430  NA 139 138  K 5.5* 5.0  CL 114* 112*  CO2 10* 12*  GLUCOSE 85 88  BUN 47* 50*  CREATININE 2.63* 2.72*  CALCIUM 9.2 8.5*   GFR Estimated Creatinine Clearance: 12.3 mL/min (A) (by C-G formula based on SCr of 2.72 mg/dL (H)). Liver Function Tests: No results for input(s): AST, ALT, ALKPHOS, BILITOT, PROT, ALBUMIN in the last 168 hours. No results for input(s): LIPASE, AMYLASE in the last 168 hours. No results for input(s): AMMONIA in the last 168 hours. Coagulation profile No results for input(s): INR, PROTIME in the last 168 hours.  CBC: Recent Labs  Lab 05/07/18 0857 05/08/18 0430  WBC 8.1 9.5  HGB 12.0 12.7  HCT 39.2 39.7  MCV 95.6 91.9  PLT 91* 100*   Cardiac Enzymes: Recent Labs  Lab 05/07/18 1815 05/07/18 2328 05/08/18 0430  TROPONINI 0.10* 0.11* 0.10*   BNP (last 3  results) No results for input(s): PROBNP in the last 8760 hours. CBG: Recent Labs  Lab 05/08/18 2135  GLUCAP 167*   D-Dimer: No results for input(s): DDIMER in the last 72 hours. Hgb A1c: No results for input(s): HGBA1C in the last 72 hours. Lipid Profile: No results for input(s): CHOL, HDL, LDLCALC, TRIG, CHOLHDL, LDLDIRECT in the last 72 hours. Thyroid function studies: No results for input(s): TSH, T4TOTAL, T3FREE, THYROIDAB in the last 72 hours.  Invalid input(s): FREET3 Anemia work up: No results for input(s): VITAMINB12, FOLATE, FERRITIN, TIBC, IRON, RETICCTPCT in the last 72 hours. Sepsis Labs: Recent Labs  Lab 05/07/18 0857 05/08/18 0430  WBC 8.1 9.5    Microbiology No results found for this or any previous visit (from the past 240 hour(s)).  Procedures and diagnostic studies:  Dg Chest 2 View  Result Date: 05/07/2018 CLINICAL DATA:  Shortness of breath.  Renal failure EXAM: CHEST - 2 VIEW COMPARISON:  April 09, 2018 FINDINGS: There is cardiomegaly with mild pulmonary venous hypertension. There are areas of scarring in the upper lobes. There is slight interstitial edema. There are small pleural effusions bilaterally. No consolidation. There is  aortic atherosclerosis. Bones are osteoporotic. IMPRESSION: Pulmonary vascular congestion with mild interstitial edema and small pleural effusions. No consolidation. Foci of scarring in the upper lobes, stable. Aortic Atherosclerosis (ICD10-I70.0). Electronically Signed   By: Lowella Grip III M.D.   On: 05/07/2018 07:37    Medications:   . apixaban  2.5 mg Oral BID  . aspirin EC  81 mg Oral Daily  . atorvastatin  80 mg Oral QPM  . calcitRIOL  0.5 mcg Oral Once per day on Mon Tue Wed Thu Fri  . feeding supplement (ENSURE ENLIVE)  237 mL Oral BID BM  . mouth rinse  15 mL Mouth Rinse BID  . metoprolol tartrate  25 mg Oral QID  . multivitamin with minerals  1 tablet Oral Daily  . nicotine  14 mg Transdermal Daily  .  pantoprazole  40 mg Oral Daily   Continuous Infusions:   LOS: 0 days   Rondell A Smith  [facility]   *Please refer to [email or web address], password [email or web address] to get updated schedule on who will round on this patient, as hospitalists switch teams weekly. If 7PM-7AM, please contact night-coverage at [email or web address], password [email or web address] for any overnight needs.

## 2018-05-08 NOTE — Progress Notes (Signed)
Patient HR sustaining 130's, MD notified.

## 2018-05-08 NOTE — Progress Notes (Signed)
PT Cancellation Note  Patient Details Name: Maureen Mendez MRN: [id number] DOB: 27-Feb-1930   Cancelled Treatment:    Reason Eval/Treat Not Completed: Medical issues which prohibited therapy(Pt HR elevated to 130's. Nurse asked PT to HOLD this am. )Will check back in pm as able.    Denice Paradise 05/08/2018, 10:17 AM [facility] Pager:  [phone]  Office:  [phone]

## 2018-05-08 NOTE — Consult Note (Addendum)
Cardiology Consultation:   Patient ID: Maureen Mendez MRN: [id number]; DOB: [birth year]  Admit date: 05/07/2018 Date of Consult: 05/08/2018  Primary Care Provider: Patient, No Pcp Per Primary Cardiologist: No primary care provider on file. [facility] Primary Electrophysiologist:  None    Patient Profile:   Maureen Mendez is a 85 y.o. 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 Tamala Julian.  History of Present Illness:   Maureen Mendez was previously living in [place] but about 2 months ago moved in with her daughter in [place].  She was admitted last month to [facility] with COPD exacerbation.  EKG during that admission showed sinus rhythm.    She presented back to the hospital with complaints of worsening shortness of breath over the prior 3 days.  She had wheezes and was dyspneic on minimal exertion.  Upon presentation blood pressure was elevated, potassium 5.5, creatinine 2.63 (about baseline), troponins mildly elevated and flat pattern 0.11, BNP 3324.2, CXR moderate vascular congestion with mild interstitial edema and small pleural effusions.  EKG today shows Atrial flutter with variable A-V block, 121 bpm, Possible Inferior infarct , age undetermined, Anterolateral infarct , age undetermined.  QTC 420   Pt was previous followed at [facility]   Last cilnic ivisitg was in July 2019   Was in [place] at the time   : Last echo there in 2017 LVEF 25% with apical infarct (done while hosp with thromboembolic CVA)  Patient has declined defibrillator in the past.  Had apical thrombus in past    She has a history of paroxysmal atrial fibrillation  Had duodenal bleed on coumadin  Amiodarone stopped due to liver toxicity.   She was also noted to have a history of noncompliance with medical therapy.   It appears that the patient  was at some point put on Eliquis for age and renal function.   Past Medical History:  Diagnosis Date  . Asthma   . CHF (congestive heart failure) ([place])   . COPD (chronic obstructive pulmonary disease) ([place])   . GERD (gastroesophageal reflux disease)   . High cholesterol   . Renal disorder     Past Surgical History:  Procedure Laterality Date  . ABDOMINAL HYSTERECTOMY       Home Medications:  Prior to Admission medications   Medication Sig Start Date End Date Taking? Authorizing Provider  acetaminophen (TYLENOL) 325 MG tablet Take 2 tablets (650 mg total) by mouth every 6 (six) hours as needed for mild pain (or Fever >/= 101). Patient taking differently: Take 325 mg by mouth every 6 (six) hours as needed for mild pain (or Fever >/= 101).  04/10/18  Yes Rizwan, Eunice Blase, MD  albuterol (PROVENTIL HFA;VENTOLIN HFA) 108 (90 Base) MCG/ACT inhaler Inhale 2 puffs into the lungs every 6 (six) hours as needed for wheezing or shortness of breath. 04/10/18  Yes Debbe Odea, MD  aspirin EC 81 MG tablet Take 81 mg by mouth daily.   Yes [provider]  atorvastatin (LIPITOR) 80 MG tablet Take 80 mg by mouth every evening.   Yes [provider]  calcitRIOL (ROCALTROL) 0.5 MCG capsule Take 0.5 mcg by mouth See admin instructions. Monday-Friday   Yes [provider]  diclofenac sodium (VOLTAREN) 1 % GEL Apply 2 g topically 4 (four) times daily as needed (pain).  Yes [provider]  docusate sodium (COLACE) 100 MG capsule Take 100 mg by mouth 2 (two) times daily as needed for mild constipation.   Yes [provider]  ELIQUIS 2.5 MG TABS tablet Take 2.5 mg by mouth daily. 02/19/18  Yes [provider]  guaiFENesin-dextromethorphan (ROBITUSSIN DM) 100-10 MG/5ML syrup Take 5 mLs by mouth every 4 (four) hours as needed for cough (chest congestion). 04/10/18  Yes Debbe Odea, MD  hydrOXYzine (ATARAX/VISTARIL) 10 MG tablet Take 10 mg by mouth 3 (three)  times daily as needed for anxiety.   Yes [provider]  metoprolol succinate (TOPROL-XL) 25 MG 24 hr tablet Take 25 mg by mouth daily.   Yes [provider]  omeprazole (PRILOSEC) 20 MG capsule Take 20 mg by mouth daily.   Yes [provider]  polyethylene glycol (MIRALAX / GLYCOLAX) packet Take 17 g by mouth daily as needed for mild constipation. 04/10/18  Yes Debbe Odea, MD  potassium chloride (K-DUR,KLOR-CON) 10 MEQ tablet Take 10 mEq by mouth every evening.   Yes [provider]    Inpatient Medications: Scheduled Meds: . apixaban  2.5 mg Oral BID  . aspirin EC  81 mg Oral Daily  . atorvastatin  80 mg Oral QPM  . calcitRIOL  0.5 mcg Oral Once per day on Mon Tue Wed Thu Fri  . feeding supplement (ENSURE ENLIVE)  237 mL Oral BID BM  . furosemide  20 mg Intravenous BID  . mouth rinse  15 mL Mouth Rinse BID  . metoprolol succinate  25 mg Oral Daily  . multivitamin with minerals  1 tablet Oral Daily  . nicotine  14 mg Transdermal Daily  . pantoprazole  40 mg Oral Daily   Continuous Infusions:  PRN Meds: acetaminophen, diclofenac sodium, docusate sodium, guaiFENesin-dextromethorphan, hydrOXYzine, ipratropium, levalbuterol, polyethylene glycol  Allergies:   No Known Allergies  Social History:   Social History   Socioeconomic History  . Marital status: Single    Spouse name: Not on file  . Number of children: Not on file  . Years of education: Not on file  . Highest education level: Not on file  Occupational History  . Not on file  Social Needs  . Financial resource strain: Not on file  . Food insecurity:    Worry: Not on file    Inability: Not on file  . Transportation needs:    Medical: Not on file    Non-medical: Not on file  Tobacco Use  . Smoking status: Current Every Day Smoker    Packs/day: 1.00    Types: Cigarettes  . Smokeless tobacco: Never Used  Substance and Sexual Activity  . Alcohol use: Not Currently  . Drug use:  Not Currently  . Sexual activity: Not on file  Lifestyle  . Physical activity:    Days per week: Not on file    Minutes per session: Not on file  . Stress: Not on file  Relationships  . Social connections:    Talks on phone: Not on file    Gets together: Not on file    Attends religious service: Not on file    Active member of club or organization: Not on file    Attends meetings of clubs or organizations: Not on file    Relationship status: Not on file  . Intimate partner violence:    Fear of current or ex partner: Not on file    Emotionally abused: Not on file  Physically abused: Not on file    Forced sexual activity: Not on file  Other Topics Concern  . Not on file  Social History Narrative  . Not on file    Family History:   History reviewed. No pertinent family history.  Pt unable to provide family history, no family present.   ROS:  Please see the history of present illness.   ROS limited due to pt confusion, unable to answer questions.   Physical Exam/Data:   Vitals:   05/08/18 0914 05/08/18 0917 05/08/18 1308 05/08/18 1425  BP: (!) 123/97  (!) 104/91   Pulse: (!) 104  (!) 131 (!) 118  Resp: 20     Temp:  (!) 97.5 F (36.4 C) 98.7 F (37.1 C)   TempSrc:  Oral Oral   SpO2:      Weight:      Height:        Intake/Output Summary (Last 24 hours) at 05/08/2018 1437 Last data filed at 05/08/2018 1046 Gross per 24 hour  Intake 480 ml  Output 850 ml  Net -370 ml   Last 3 Weights 05/08/2018 05/07/2018 05/07/2018  Weight (lbs) 121 lb 11.1 oz 123 lb 8 oz 105 lb  Weight (kg) 55.2 kg 56.019 kg 47.628 kg     Body mass index is 21.56 kg/m.  General: Thin, chronically ill-appearing female, in no acute distress HEENT: normal Lymph: no adenopathy Neck: + JVD Endocrine:  No thryomegaly Vascular: No carotid bruits; FA pulses 2+ bilaterally  Cardiac:  normal S1, S2; irregularly irregular rhythm, rapid rate; no murmur  Lungs:  clear to auscultation bilaterally, no  wheezing, rhonchi or rales  Abd: soft, nontender, no hepatomegaly  Ext: Trace to 1+ left lower leg edema Musculoskeletal:  No deformities, BUE and BLE strength normal and equal Skin: warm and dry  Neuro: Patient oriented to self only, unable to provide much information Psych: Slightly agitated in setting of being incontinent of stool in the bed.  EKG:  The EKG was personally reviewed and demonstrates:  Atrial flutter with variable A-V block, 121 bpm, Possible Inferior infarct , age undetermined, Anterolateral infarct , age undetermined.  QTC 420 Telemetry:  Telemetry was personally reviewed and demonstrates: Atrial flutter with rates 100s-120s  Relevant CV Studies:  Echo 04/08/2018 Study Conclusions - Left ventricle: The cavity size was normal. There was severe   concentric hypertrophy. Systolic function was moderately reduced.   The estimated ejection fraction was in the range of 35% to 40%.   Diffuse hypokinesis. Doppler parameters are consistent with   pseudonormal left ventricular relaxation (grade 2 diastolic   dysfunction). The E/e&' ratio is >15, suggesting elevated LV   filling pressure. - Mitral valve: Mildly thickened leaflets . There was mild   regurgitation. - Left atrium: Severely dilated. - Right ventricle: The cavity size was mildly dilated. Moderately   reduced systolic function. - Right atrium: Severely dilated. - Tricuspid valve: There was mild regurgitation. - Pulmonary arteries: PA peak pressure: 45 mm Hg (S). - Inferior vena cava: The vessel was dilated. The respirophasic   diameter changes were blunted (< 50%), consistent with elevated   central venous pressure.  Impressions: - LVEF 35-40%, severe LVH, global hypokinesis, grade 2 DD, elevated   LV filling pressure, mild MR, severe LAE, mildly dilated RV with   moderate systolic dysfunction, severe RAE, mild TR, RVSP 45 mmHg,   dilated IVC.  Myocardial perfusion resting images only 03/10/2015  [facility]) Severe perfusion defect in the entire apex,  with significant FDG activity in the area indicating likely viable myocardium.  Laboratory Data:  Chemistry Recent Labs  Lab 05/07/18 0857 05/08/18 0430  NA 139 138  K 5.5* 5.0  CL 114* 112*  CO2 10* 12*  GLUCOSE 85 88  BUN 47* 50*  CREATININE 2.63* 2.72*  CALCIUM 9.2 8.5*  GFRNONAA 16* 15*  GFRAA 18* 18*  ANIONGAP 15 14    No results for input(s): PROT, ALBUMIN, AST, ALT, ALKPHOS, BILITOT in the last 168 hours. Hematology Recent Labs  Lab 05/07/18 0857 05/08/18 0430  WBC 8.1 9.5  RBC 4.10 4.32  HGB 12.0 12.7  HCT 39.2 39.7  MCV 95.6 91.9  MCH 29.3 29.4  MCHC 30.6 32.0  RDW 23.3* 22.9*  PLT 91* 100*   Cardiac Enzymes Recent Labs  Lab 05/07/18 1815 05/07/18 2328 05/08/18 0430  TROPONINI 0.10* 0.11* 0.10*    Recent Labs  Lab 05/07/18 0902  TROPIPOC 0.12*    BNP Recent Labs  Lab 05/07/18 0857  BNP 3,324.2*    DDimer No results for input(s): DDIMER in the last 168 hours.  Radiology/Studies:  Dg Chest 2 View  Result Date: 05/07/2018 CLINICAL DATA:  Shortness of breath.  Renal failure EXAM: CHEST - 2 VIEW COMPARISON:  April 09, 2018 FINDINGS: There is cardiomegaly with mild pulmonary venous hypertension. There are areas of scarring in the upper lobes. There is slight interstitial edema. There are small pleural effusions bilaterally. No consolidation. There is aortic atherosclerosis. Bones are osteoporotic. IMPRESSION: Pulmonary vascular congestion with mild interstitial edema and small pleural effusions. No consolidation. Foci of scarring in the upper lobes, stable. Aortic Atherosclerosis (ICD10-I70.0). Electronically Signed   By: Lowella Grip III M.D.   On: 05/07/2018 07:37    Assessment and Plan:   Acute on chronic systolic heart failure Pt is a 85 yo with long history of systolic CHF    Recently admitted for COPD exacerbation   Presented to [place] on 2/19 with worseing SOB   -Recent echo  done here shows EF 35-40% with severe LVH, global hypokinesis, grade 2 diastolic dysfunction, elevated LV pressures, mild MR, severe LAE/RAE, mildly dilated RV with moderate systolic dysfunction. - Managed at home with torsemide 20 mg daily and beta-blocker. Not on ACE inhibitor due to low BP and CKD. She has a history of non- compliance.  -BNP 3324.2, CXR moderate vascular congestion with mild interstitial edema and small pleural effusions. Patient has been diuresing with Lasix 20 mg IV twice daily.  But only modest urine output is documented with 500 mL for the latter part of yesterday and 350 mL so far today.  Weight is down 2 pounds overnight. -Although patient is confused she does report that her breathing is better today. Still has JVD and mild LLE edema.   Plan:  Give larger dose of lasix   80 mg   Follow response -Strict I&O, daily wt. Low sodium diet, 1500 ml fluid restriction.  Paroxysmal atrial fibrillation /flutter  -Per review of prior office notes patient is no longer on warfarin due to major bleeding event w Amiodarone stopped due to liver toxicity.  She was also noted to have a history of noncompliance with medical therapy.   She came in on Eliqus (not clear when started) -Will change toprol XL to metoprolol tartrate 25 mg QID for better rate control.   CKD -Serum creatinine 2.63 on presentation, 2.72 today. -Watch renal function with diuresis.  Hypertension -Blood pressure initially significantly elevated. Blood pressure is  improved today.  Slowly increase b blocker and follow  .  History of apical thrombus  On Eliquis   Maintained on ASA  - History of ventricular tachycardia -Per notes, resolved on meds.  History of thromboembolic CVA 4734 -On aspirin and anticoagulation.    For questions or updates, please contact [facility] Please consult [email or web address] for contact info under     Signed, Daune Perch, NP  05/08/2018 2:37 PM   Pt seen and examined   I have  reviewed and amended note above by Maryla Morrow   Pt is an 85 yo with hx of chornic systolic CHF, PAF, Hx CVA Presents with SOB        Since admt has been in [facility]    Acute on chronic systolic CHF   As noted above would recomm increasing diuretic dose and following  WOuld increase metoprolol for better rate control Continue Eliquis  Watch renal function      Dorris Carnes MD

## 2018-05-08 NOTE — Progress Notes (Signed)
Initial Nutrition Assessment  DOCUMENTATION CODES:   Severe malnutrition in context of chronic illness  INTERVENTION:   -Continue Ensure Enlive po BID, each supplement provides 350 kcal and 20 grams of protein -MVI with minerals daily -Magic Cup BID with meals, each supplement provides 290 kcal and 9 grams protein -Downgrade diet to dysphagia 2 diet (mechanical soft) for ease of intake, due to poor dentition  NUTRITION DIAGNOSIS:   Severe Malnutrition related to chronic illness(COPD) as evidenced by moderate fat depletion, severe fat depletion, moderate muscle depletion, severe muscle depletion.  GOAL:   Patient will meet greater than or equal to 90% of their needs  MONITOR:   PO intake, Supplement acceptance, Labs, Weight trends, Skin, I & O's  REASON FOR ASSESSMENT:   Malnutrition Screening Tool, Consult Assessment of nutrition requirement/status  ASSESSMENT:   Maureen Mendez is a 85 y.o. female with medical history significant of chronic kidney disease stage IV, recent multiple territory stroke on Eliquis, history of congestive heart failure with known ejection fraction of 35%, smoker, COPD, GERD, paroxysmal A. fib, poor health maintenance who presents to the emergency room with shortness of breath for 3 days.  Patient was living at [facility] about 2 months ago then her daughter brought her to live with her at [place].  Patient was admitted last month in our hospital with COPD exacerbation and treated with bronchodilators and antibiotics with some clinical improvement.  Patient apparently on Eliquis since last 3 to 4 years, reviewed her neurologist report from last year.  Being on Eliquis was not mentioned in last hospitalization.  According to the patient's daughter, she is not motivated.  Patient usually does not keep up with medications.  She did somehow better after discharge last month, however last 3 days she has been increasingly short of breath.  Patient was wheezing all  the time.  She cannot get up and walk without getting dyspneic.  Dry cough.  No fever.  No sick contacts.  No flulike symptoms.  Poor appetite.  Pt admitted with SOB and COPD exacerbation.   Reviewed I/O's: -260 ml x 24 hours  Case discussed with RN, who reports that pt is taking medications, foods, and liquids well, however, has minimal teeth. Pt is also hard of hearing and can be difficult to communicate with.   Spoke with pt at bedside, who reports that she has a good appetite. She confirms that she lives with her daughter and both shares cooking responsibilities. She generally consumes 3 meals per day (Breakfast: eggs, grits, sausage/bacon, and coffee; Lunch: bologna sandwich; Dinner: soup and sandwich OR meat, starch, and vegetable). Pt reports that her daughter often cooks food until they are tender and shreds/minces meats for ease of intake. Observed breakfast tray- she consumed 75% of eggs, 25% of grits, 0% sausage, and orange juice.   Pt endorses wt loss, but unsure of UBW or time frame/quantity of weight loss. Per CareEverywhere records, recorded wt of of 120# on 04/16/17. Unsure of accuracy of wt, as wt readings of have ranged between 105-122# since admission.   Discussed with pt importance of good meal and supplement intake to promote healing. Pt amenable to Ensure supplements, as she has taken them in the past (she is unsure how often she receives them at home), as well as a mechanically altered diet for ease of intake.   Labs reviewed.   NUTRITION - FOCUSED PHYSICAL EXAM:    Most Recent Value  Orbital Region  Moderate depletion  Upper Arm Region  Severe depletion  Thoracic and Lumbar Region  Severe depletion  Buccal Region  Moderate depletion  Temple Region  Severe depletion  Clavicle Bone Region  Severe depletion  Clavicle and Acromion Bone Region  Severe depletion  Scapular Bone Region  Severe depletion  Dorsal Hand  Severe depletion  Patellar Region  Severe depletion   Anterior Thigh Region  Severe depletion  Posterior Calf Region  Severe depletion  Edema (RD Assessment)  None  Hair  Reviewed  Eyes  Reviewed  Mouth  Reviewed  Skin  Reviewed  Nails  Reviewed       Diet Order:   Diet Order            DIET DYS 2 Room service appropriate? Yes; Fluid consistency: Thin  Diet effective now              EDUCATION NEEDS:   Education needs have been addressed  Skin:  Skin Assessment: Reviewed RN Assessment  Last BM:  05/08/17  Height:   Ht Readings from Last 1 Encounters:  05/07/18 5\' 3"  (1.6 m)    Weight:   Wt Readings from Last 1 Encounters:  05/08/18 55.2 kg    Ideal Body Weight:  52.3 kg  BMI:  Body mass index is 21.56 kg/m.  Estimated Nutritional Needs:   Kcal:  1450-1650  Protein:  60-75 grams  Fluid:  > 1.4 L    Alazne Quant A. Jimmye Norman, RD, LDN, CDE Pager: [phone] After hours Pager: [phone]

## 2018-05-08 NOTE — Plan of Care (Signed)
  Problem: Education: Goal: Ability to demonstrate management of disease process will improve Outcome: Progressing   Problem: Education: Goal: Ability to verbalize understanding of medication therapies will improve Outcome: Progressing   Problem: Clinical Measurements: Goal: Respiratory complications will improve Outcome: Progressing   Problem: Activity: Goal: Risk for activity intolerance will decrease Outcome: Progressing

## 2018-05-08 NOTE — Discharge Instructions (Signed)

## 2018-05-08 NOTE — Progress Notes (Signed)
Received consult for Placement Patient must be evaluated by Physical Therapy to determine his mobility for DCP - home with [facility] vs SNF; CM will continue to follow for progression of care; B Elimelech Houseman RN,MHA,BSn [phone]

## 2018-05-09 DIAGNOSIS — I509 Heart failure, unspecified: Secondary | ICD-10-CM

## 2018-05-09 DIAGNOSIS — E872 Acidosis: Secondary | ICD-10-CM | POA: Diagnosis present

## 2018-05-09 DIAGNOSIS — I5043 Acute on chronic combined systolic (congestive) and diastolic (congestive) heart failure: Secondary | ICD-10-CM | POA: Diagnosis present

## 2018-05-09 DIAGNOSIS — E43 Unspecified severe protein-calorie malnutrition: Secondary | ICD-10-CM | POA: Diagnosis present

## 2018-05-09 DIAGNOSIS — I4892 Unspecified atrial flutter: Secondary | ICD-10-CM | POA: Diagnosis present

## 2018-05-09 DIAGNOSIS — J449 Chronic obstructive pulmonary disease, unspecified: Secondary | ICD-10-CM | POA: Diagnosis present

## 2018-05-09 DIAGNOSIS — R042 Hemoptysis: Secondary | ICD-10-CM | POA: Diagnosis not present

## 2018-05-09 DIAGNOSIS — R0602 Shortness of breath: Secondary | ICD-10-CM | POA: Diagnosis present

## 2018-05-09 DIAGNOSIS — N179 Acute kidney failure, unspecified: Secondary | ICD-10-CM

## 2018-05-09 DIAGNOSIS — Z515 Encounter for palliative care: Secondary | ICD-10-CM | POA: Diagnosis not present

## 2018-05-09 DIAGNOSIS — E875 Hyperkalemia: Secondary | ICD-10-CM | POA: Diagnosis present

## 2018-05-09 DIAGNOSIS — I2510 Atherosclerotic heart disease of native coronary artery without angina pectoris: Secondary | ICD-10-CM | POA: Diagnosis present

## 2018-05-09 DIAGNOSIS — Z8673 Personal history of transient ischemic attack (TIA), and cerebral infarction without residual deficits: Secondary | ICD-10-CM | POA: Diagnosis not present

## 2018-05-09 DIAGNOSIS — Z7982 Long term (current) use of aspirin: Secondary | ICD-10-CM | POA: Diagnosis not present

## 2018-05-09 DIAGNOSIS — J9601 Acute respiratory failure with hypoxia: Secondary | ICD-10-CM | POA: Diagnosis not present

## 2018-05-09 DIAGNOSIS — R945 Abnormal results of liver function studies: Secondary | ICD-10-CM | POA: Diagnosis not present

## 2018-05-09 DIAGNOSIS — N184 Chronic kidney disease, stage 4 (severe): Secondary | ICD-10-CM | POA: Diagnosis present

## 2018-05-09 DIAGNOSIS — G9341 Metabolic encephalopathy: Secondary | ICD-10-CM | POA: Diagnosis present

## 2018-05-09 DIAGNOSIS — Z7901 Long term (current) use of anticoagulants: Secondary | ICD-10-CM | POA: Diagnosis not present

## 2018-05-09 DIAGNOSIS — Z681 Body mass index (BMI) 19 or less, adult: Secondary | ICD-10-CM | POA: Diagnosis not present

## 2018-05-09 DIAGNOSIS — I5022 Chronic systolic (congestive) heart failure: Secondary | ICD-10-CM | POA: Diagnosis not present

## 2018-05-09 DIAGNOSIS — N189 Chronic kidney disease, unspecified: Secondary | ICD-10-CM

## 2018-05-09 DIAGNOSIS — F17210 Nicotine dependence, cigarettes, uncomplicated: Secondary | ICD-10-CM | POA: Diagnosis present

## 2018-05-09 DIAGNOSIS — Z7189 Other specified counseling: Secondary | ICD-10-CM | POA: Diagnosis not present

## 2018-05-09 DIAGNOSIS — I480 Paroxysmal atrial fibrillation: Secondary | ICD-10-CM | POA: Diagnosis present

## 2018-05-09 DIAGNOSIS — I248 Other forms of acute ischemic heart disease: Secondary | ICD-10-CM | POA: Diagnosis present

## 2018-05-09 DIAGNOSIS — I5023 Acute on chronic systolic (congestive) heart failure: Secondary | ICD-10-CM | POA: Diagnosis not present

## 2018-05-09 DIAGNOSIS — K219 Gastro-esophageal reflux disease without esophagitis: Secondary | ICD-10-CM | POA: Diagnosis present

## 2018-05-09 DIAGNOSIS — J9621 Acute and chronic respiratory failure with hypoxia: Secondary | ICD-10-CM | POA: Diagnosis present

## 2018-05-09 DIAGNOSIS — J441 Chronic obstructive pulmonary disease with (acute) exacerbation: Secondary | ICD-10-CM | POA: Diagnosis present

## 2018-05-09 DIAGNOSIS — J440 Chronic obstructive pulmonary disease with acute lower respiratory infection: Secondary | ICD-10-CM | POA: Diagnosis not present

## 2018-05-09 DIAGNOSIS — E7800 Pure hypercholesterolemia, unspecified: Secondary | ICD-10-CM | POA: Diagnosis present

## 2018-05-09 DIAGNOSIS — J189 Pneumonia, unspecified organism: Secondary | ICD-10-CM | POA: Diagnosis not present

## 2018-05-09 DIAGNOSIS — I130 Hypertensive heart and chronic kidney disease with heart failure and stage 1 through stage 4 chronic kidney disease, or unspecified chronic kidney disease: Secondary | ICD-10-CM | POA: Diagnosis present

## 2018-05-09 LAB — BLOOD GAS, ARTERIAL
Acid-base deficit: 9 mmol/L — ABNORMAL HIGH (ref 0.0–2.0)
Bicarbonate: 15.8 mmol/L — ABNORMAL LOW (ref 20.0–28.0)
Drawn by: 275531
O2 Content: 2 L/min
O2 Saturation: 92.2 %
PATIENT TEMPERATURE: 98.6
PO2 ART: 69.6 mmHg — AB (ref 83.0–108.0)
pCO2 arterial: 31.1 mmHg — ABNORMAL LOW (ref 32.0–48.0)
pH, Arterial: 7.327 — ABNORMAL LOW (ref 7.350–7.450)

## 2018-05-09 LAB — BASIC METABOLIC PANEL
Anion gap: 10 (ref 5–15)
BUN: 59 mg/dL — ABNORMAL HIGH (ref 8–23)
CO2: 18 mmol/L — ABNORMAL LOW (ref 22–32)
Calcium: 8.4 mg/dL — ABNORMAL LOW (ref 8.9–10.3)
Chloride: 107 mmol/L (ref 98–111)
Creatinine, Ser: 2.98 mg/dL — ABNORMAL HIGH (ref 0.44–1.00)
GFR calc Af Amer: 16 mL/min — ABNORMAL LOW (ref >60)
GFR calc non Af Amer: 14 mL/min — ABNORMAL LOW (ref >60)
Glucose, Bld: 134 mg/dL — ABNORMAL HIGH (ref 70–99)
Potassium: 5 mmol/L (ref 3.5–5.1)
Sodium: 135 mmol/L (ref 135–145)

## 2018-05-09 MED ORDER — LORAZEPAM 2 MG/ML IJ SOLN
0.5000 mg | Freq: Once | INTRAMUSCULAR | Status: AC
  Administered 2018-05-09: 0.5 mg via INTRAVENOUS
  Filled 2018-05-09: qty 1

## 2018-05-09 MED ORDER — METHYLPREDNISOLONE SODIUM SUCC 40 MG IJ SOLR
40.0000 mg | Freq: Once | INTRAMUSCULAR | Status: AC
  Administered 2018-05-09: 40 mg via INTRAVENOUS
  Filled 2018-05-09: qty 1

## 2018-05-09 NOTE — Progress Notes (Signed)
Progress Note  Patient Name: Maureen Mendez Date of Encounter: 05/09/2018  Primary Cardiologist:   Subjective   Patient appears groggy   Answers questions  (nursing reports agitation last night , got Ativan) Inpatient Medications    Scheduled Meds: . apixaban  2.5 mg Oral BID  . aspirin EC  81 mg Oral Daily  . atorvastatin  80 mg Oral QPM  . calcitRIOL  0.5 mcg Oral Once per day on Mon Tue Wed Thu Fri  . feeding supplement (ENSURE ENLIVE)  237 mL Oral BID BM  . mouth rinse  15 mL Mouth Rinse BID  . methylPREDNISolone (SOLU-MEDROL) injection  40 mg Intravenous Once  . metoprolol tartrate  25 mg Oral QID  . multivitamin with minerals  1 tablet Oral Daily  . nicotine  14 mg Transdermal Daily  . pantoprazole  40 mg Oral Daily   Continuous Infusions:  PRN Meds: acetaminophen, diclofenac sodium, docusate sodium, guaiFENesin-dextromethorphan, hydrOXYzine, ipratropium, levalbuterol, polyethylene glycol, polyvinyl alcohol   Vital Signs    Vitals:   05/08/18 1819 05/08/18 2000 05/09/18 0044 05/09/18 0418  BP: 123/90 94/76 96/68  (!) 118/96  Pulse: 95 80 97 84  Resp:  20 18 16   Temp:  (!) 97.3 F (36.3 C) (!) 97.4 F (36.3 C) (!) 97.3 F (36.3 C)  TempSrc:  Oral Oral Oral  SpO2: 96% 94% 92% 100%  Weight:    56.5 kg  Height:        Intake/Output Summary (Last 24 hours) at 05/09/2018 0755 Last data filed at 05/09/2018 0435 Gross per 24 hour  Intake 290 ml  Output 551 ml  Net -261 ml   Last 3 Weights 05/09/2018 05/08/2018 05/07/2018  Weight (lbs) 124 lb 9 oz 121 lb 11.1 oz 123 lb 8 oz  Weight (kg) 56.5 kg 55.2 kg 56.019 kg      Telemetry    Atrial flutter  80s  - Personally Reviewed  ECG    Physical Exam    GEN:  Thin 85 yo    Cardiac:  Irreg ir, no murmurs, rubs, or gallops.  Respiratory:  Wheezes   GI: Soft, nontender, non-distended  MS: Tr edema;   Labs    Chemistry Recent Labs  Lab 05/07/18 0857 05/08/18 0430  NA 139 138  K 5.5* 5.0  CL 114* 112*    CO2 10* 12*  GLUCOSE 85 88  BUN 47* 50*  CREATININE 2.63* 2.72*  CALCIUM 9.2 8.5*  GFRNONAA 16* 15*  GFRAA 18* 18*  ANIONGAP 15 14     Hematology Recent Labs  Lab 05/07/18 0857 05/08/18 0430  WBC 8.1 9.5  RBC 4.10 4.32  HGB 12.0 12.7  HCT 39.2 39.7  MCV 95.6 91.9  MCH 29.3 29.4  MCHC 30.6 32.0  RDW 23.3* 22.9*  PLT 91* 100*    Cardiac Enzymes Recent Labs  Lab 05/07/18 1815 05/07/18 2328 05/08/18 0430  TROPONINI 0.10* 0.11* 0.10*    Recent Labs  Lab 05/07/18 0902  TROPIPOC 0.12*     BNP Recent Labs  Lab 05/07/18 0857  BNP 3,324.2*     DDimer No results for input(s): DDIMER in the last 168 hours.   Radiology    No results found.  Cardiac Studies    Patient Profile     85 y.o. 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 Tamala Julian. Pt was followed at [facility] until recently (including cardiology) Assessment & Plan   1  Acute on cahronic systolic CHF  Echo in Jan LVEf 35 to 40%  Mod RV dysfunciton      Pt has not had any signif response from lasix yesterday  Wt  Up   I/O with insignif diuresis ater 80 mg lasix yesterday     Wheezing on exam Unfort Cr has gone up from yesterday   Would recheck later today and in am   Dose lasix with 80 mg with 2.5 mg Zaroxyln prior  if improved      2  PAF/flutter  Remains rate controlled    On Eliquis   Echo shows severe atrial dilation   Did not tolerate amio in past   3   CKD  Cr 2.98 today     4   HTN   Fair    5  Hx CVA      OVerall prognosis is guarded   Pt has no reserve with CKD in setting of severe CHF       For questions or updates, please contact [facility] Please consult [email or web address] for contact info under        Signed, Dorris Carnes, MD  05/09/2018, 7:55 AM

## 2018-05-09 NOTE — Progress Notes (Signed)
Patient would not take oral medications.  Crushed them and put them in apple sauce, but would not take.

## 2018-05-09 NOTE — Progress Notes (Signed)
Patient keeps removing purewick out of place.  Refusing by mouth medications at this time for anxiety.  Requested one time dose of Ativan.

## 2018-05-09 NOTE — Progress Notes (Signed)
Patient having increase of confusion at night.  Low bed ordered given patient's age and history of falls.

## 2018-05-09 NOTE — Evaluation (Signed)
Occupational Therapy Evaluation Patient Details Name: Maureen Mendez MRN: [id number] DOB: 09-Feb-1930 Today's Date: 05/09/2018    History of Present Illness Pt is an 85 y.o. female admitted 05/07/18 with SOB; worked up for CHF exacerbation. PMH includes CKD IV, stroke, CHF, COPD, asthma, afib. Of note, recent admission for COPD.    Clinical Impression   Unable to obtain PTA as pt was too lethargic to perform functional tasks without totalA this AM. Pt performing bed mobility and lateral scoot to chair with totalA +2 for safety. Pt awakening minimally and difficult to arouse. Pt did not initiate assist with grooming. Pt requires continued OT skilled services for ADL, mobility and safety in SNF setting. OT to follow acutely.     Follow Up Recommendations  SNF;Supervision/Assistance - 24 hour    Equipment Recommendations  Other (comment)(to be determined)    Recommendations for Other Services       Precautions / Restrictions Precautions Precautions: Fall Restrictions Weight Bearing Restrictions: No      Mobility Bed Mobility Overal bed mobility: Needs Assistance Bed Mobility: Rolling;Sidelying to Sit Rolling: Total assist Sidelying to sit: Total assist;+2 for physical assistance(sitting behind at EOB due to zero sitting balance)       General bed mobility comments: zero sitting balance  Transfers Overall transfer level: Needs assistance Equipment used: None Transfers: Lateral/Scoot Transfers          Lateral/Scoot Transfers: Total assist;From elevated surface(using pads to assist from EOB to sitting up in chair)      Balance Overall balance assessment: Needs assistance Sitting-balance support: Single extremity supported(not using BUEs for support at EOB with cues.) Sitting balance-Leahy Scale: Zero                                     ADL either performed or assessed with clinical judgement   ADL Overall ADL's : Needs  assistance/impaired Eating/Feeding: Total assistance   Grooming: Total assistance   Upper Body Bathing: Total assistance   Lower Body Bathing: Total assistance   Upper Body Dressing : Total assistance   Lower Body Dressing: Total assistance   Toilet Transfer: Total assistance;+2 for physical assistance;+2 for safety/equipment;BSC   Toileting- Clothing Manipulation and Hygiene: Total assistance       Functional mobility during ADLs: Total assistance;+2 for physical assistance;Cueing for safety;Cueing for sequencing(lateral scoot only; DNT attempt standing)       Vision   Vision Assessment?: Vision impaired- to be further tested in functional context     Perception     Praxis      Pertinent Vitals/Pain Pain Assessment: Faces Pain Score: 0-No pain     Hand Dominance     Extremity/Trunk Assessment Upper Extremity Assessment Upper Extremity Assessment: Generalized weakness   Lower Extremity Assessment Lower Extremity Assessment: Generalized weakness;Defer to PT evaluation   Cervical / Trunk Assessment Cervical / Trunk Assessment: Kyphotic   Communication Communication Communication: Expressive difficulties   Cognition Arousal/Alertness: Lethargic Behavior During Therapy: Flat affect Overall Cognitive Status: No family/caregiver present to determine baseline cognitive functioning                                 General Comments: Pt unable to follow commands due to lethargy. Unable to see pt perform functional tasks as she did minimally aroused to stimulus.   General Comments  Pt difficult to arouse. NT  alerted on how pt transferred to recliner    Exercises     Shoulder Instructions      Home Living Family/patient expects to be discharged to:: Skilled nursing facility                                 Additional Comments: Poor historian and too lethargic to answer questions today.      Prior Functioning/Environment Level of  Independence: Needs assistance(unable to obtain PTA.)        Comments: unable to obtain PTA due to poor historian and too lethargic to perform        OT Problem List: Decreased strength;Decreased range of motion;Decreased activity tolerance;Impaired balance (sitting and/or standing);Decreased safety awareness      OT Treatment/Interventions: Self-care/ADL training;Therapeutic exercise;Neuromuscular education;Energy conservation;Therapeutic activities;Patient/family education;Balance training    OT Goals(Current goals can be found in the care plan section) Acute Rehab OT Goals Patient Stated Goal: None stated OT Goal Formulation: With patient Time For Goal Achievement: 05/23/18 Potential to Achieve Goals: Fair ADL Goals Pt Will Perform Grooming: with set-up Pt Will Perform Lower Body Dressing: with set-up Pt Will Transfer to Toilet: bedside commode;with min assist Pt/caregiver will Perform Home Exercise Program: Both right and left upper extremity;With written HEP provided Additional ADL Goal #1: Pt will tolerate 10 mins of functional activity with minA for ADL.  OT Frequency: Min 2X/week   Barriers to D/C: Decreased caregiver support          Co-evaluation              AM-PAC OT "6 Clicks" Daily Activity     Outcome Measure Help from another person eating meals?: Total Help from another person taking care of personal grooming?: Total Help from another person toileting, which includes using toliet, bedpan, or urinal?: Total Help from another person bathing (including washing, rinsing, drying)?: Total Help from another person to put on and taking off regular upper body clothing?: Total Help from another person to put on and taking off regular lower body clothing?: Total 6 Click Score: 6   End of Session Equipment Utilized During Treatment: Gait belt Nurse Communication: Mobility status  Activity Tolerance: Patient limited by lethargy;Treatment limited secondary to  medical complications (Comment) Patient left: in chair;with call bell/phone within reach;with chair alarm set  OT Visit Diagnosis: Unsteadiness on feet (R26.81);Repeated falls (R29.6);Muscle weakness (generalized) (M62.81)                Time: 4132-4401 OT Time Calculation (min): 25 min Charges:  OT General Charges $OT Visit: 1 Visit OT Evaluation $OT Eval Moderate Complexity: 1 Mod  Darryl Nestle) Marsa Aris OTR/L Acute Rehabilitation Services Pager: [phone] Office: [phone]   Fredda Hammed 05/09/2018, 11:33 AM

## 2018-05-09 NOTE — Progress Notes (Signed)
Pt is still not very responsive except to stimulus.  Tried feeding patient while she was sitting in chair-   Pt pocketing food.   Had to do sternal rub to get patient to wake up enough to eat and drink small amount.     Was able to get pt to stay awake enough to give 1 pill this afternoon.  Did take a few sips of chocolate ensure.  Patient was sitting up right in chair while giving medications and food.     MD is aware of patient cognitive status.   Ordered ABGs

## 2018-05-09 NOTE — NC FL2 (Signed)
[place] LEVEL OF CARE SCREENING TOOL     IDENTIFICATION  Patient Name: Maureen Mendez Birthdate: 11/22/1929 Sex: female Admission Date (Current Location): 05/07/2018  [facility] and [place] Number:  Herbalist and Address:  [facility] [place], [place] [address] [place], [place] [address]      Provider Number: [id number]  Attending Physician Name and Address:  Norval Morton, MD  Relative Name and Phone Number:  Thurmon Fair, daughter, [phone]    Current Level of Care: Hospital Recommended Level of Care: [place] Prior Approval Number:    Date Approved/Denied:   PASRR Number: [id number]  Discharge Plan:      Current Diagnoses: Patient Active Problem List   Diagnosis Date Noted  . Acute on chronic congestive heart failure ([place]) 05/09/2018  . Shortness of breath 05/07/2018  . Chronic systolic CHF (congestive heart failure) ([place]) 05/07/2018  . Smoker 05/07/2018  . Paroxysmal A-fib ([place]) 05/07/2018  . CKD (chronic kidney disease) stage 4, GFR 15-29 ml/min (HCC) 04/10/2018  . COPD with acute exacerbation ([place]) 04/06/2018  . CAD (coronary artery disease) 04/06/2018  . CVA (cerebral vascular accident) ([place]) 04/06/2018  . Elevated troponin 04/06/2018    Orientation RESPIRATION BLADDER Height & Weight     Self, Place  O2(nasal cannula 2L) Incontinent, External catheter Weight: 56.5 kg Height:  5\' 3"  (160 cm)  BEHAVIORAL SYMPTOMS/MOOD NEUROLOGICAL BOWEL NUTRITION STATUS      Incontinent Diet(please see DC summary)  AMBULATORY STATUS COMMUNICATION OF NEEDS Skin   Extensive Assist Verbally Normal                       Personal Care Assistance Level of Assistance  Bathing, Feeding, Dressing Bathing Assistance: Maximum assistance Feeding assistance: Limited assistance Dressing Assistance: Maximum assistance     Functional Limitations Info  Sight, Hearing, Speech Sight Info: Impaired Hearing Info:  Adequate Speech Info: Adequate    SPECIAL CARE FACTORS FREQUENCY  PT (By licensed PT), OT (By licensed OT)     PT Frequency: 5x/week OT Frequency: 5x/week            Contractures Contractures Info: Not present    Additional Factors Info  Code Status, Allergies Code Status Info: Full Allergies Info: No Known Allergies           Current Medications (05/09/2018):  This is the current hospital active medication list Current Facility-Administered Medications  Medication Dose Route Frequency Provider Last Rate Last Dose  . acetaminophen (TYLENOL) tablet 325 mg  325 mg Oral Q6H PRN Barb Merino, MD   325 mg at 05/08/18 1312  . apixaban (ELIQUIS) tablet 2.5 mg  2.5 mg Oral BID Barb Merino, MD   2.5 mg at 05/09/18 1056  . aspirin EC tablet 81 mg  81 mg Oral Daily Barb Merino, MD   81 mg at 05/09/18 1057  . atorvastatin (LIPITOR) tablet 80 mg  80 mg Oral QPM Barb Merino, MD   80 mg at 05/08/18 1820  . calcitRIOL (ROCALTROL) capsule 0.5 mcg  0.5 mcg Oral Once per day on Mon Tue Wed Thu Fri Barb Merino, MD   0.5 mcg at 05/08/18 1015  . diclofenac sodium (VOLTAREN) 1 % transdermal gel 2 g  2 g Topical QID PRN Smith, Rondell A, MD      . docusate sodium (COLACE) capsule 100 mg  100 mg Oral BID PRN Barb Merino, MD      . feeding supplement (ENSURE  ENLIVE) (ENSURE ENLIVE) liquid 237 mL  237 mL Oral BID BM Barb Merino, MD   237 mL at 05/08/18 1636  . guaiFENesin-dextromethorphan (ROBITUSSIN DM) 100-10 MG/5ML syrup 5 mL  5 mL Oral Q4H PRN Barb Merino, MD      . hydrOXYzine (ATARAX/VISTARIL) tablet 10 mg  10 mg Oral TID PRN Barb Merino, MD      . ipratropium (ATROVENT) nebulizer solution 0.5 mg  0.5 mg Nebulization Q6H PRN Smith, Rondell A, MD      . levalbuterol (XOPENEX) nebulizer solution 0.63 mg  0.63 mg Nebulization Q6H PRN Fuller Plan A, MD      . MEDLINE mouth rinse  15 mL Mouth Rinse BID Tamala Julian, Rondell A, MD   15 mL at 05/08/18 1313  . metoprolol tartrate  (LOPRESSOR) tablet 25 mg  25 mg Oral QID Fay Records, MD   25 mg at 05/09/18 1056  . multivitamin with minerals tablet 1 tablet  1 tablet Oral Daily Fuller Plan A, MD   1 tablet at 05/09/18 1058  . nicotine (NICODERM CQ - dosed in mg/24 hours) patch 14 mg  14 mg Transdermal Daily Barb Merino, MD   14 mg at 05/09/18 1101  . pantoprazole (PROTONIX) EC tablet 40 mg  40 mg Oral Daily Barb Merino, MD   40 mg at 05/09/18 1058  . polyethylene glycol (MIRALAX / GLYCOLAX) packet 17 g  17 g Oral Daily PRN Barb Merino, MD      . polyvinyl alcohol (LIQUIFILM TEARS) 1.4 % ophthalmic solution 1 drop  1 drop Both Eyes QID PRN Norval Morton, MD   1 drop at 05/09/18 0054     Discharge Medications: Please see discharge summary for a list of discharge medications.  Relevant Imaging Results:  Relevant Lab Results:   Additional Information SSN: [id number]  Estanislado Emms, LCSW

## 2018-05-09 NOTE — Clinical Social Work Note (Signed)
Clinical Social Work Assessment  Patient Details  Name: Maureen Mendez MRN: [id number] Date of Birth: 1929/12/03  Date of referral:  05/09/18               Reason for consult:  Discharge Planning                Permission sought to share information with:  Case Manager Permission granted to share information::  Yes, Verbal Permission Granted  Name::        Agency::  Daughter declines SNF  Relationship::  Daughter  Contact Information:  Quarry manager  Housing/Transportation Living arrangements for the past 2 months:  Single Family Home Source of Information:  Adult Children Patient Interpreter Needed:  None Criminal Activity/Legal Involvement Pertinent to Current Situation/Hospitalization:  No - Comment as needed Significant Relationships:  Adult Children, Other Family Members Lives with:  Adult Children Do you feel safe going back to the place where you live?  Yes Need for family participation in patient care:  Yes (Comment)(Patient is oriented only to person)  Care giving concerns:    Patient is oriented to person. Patient is from home with her daughter and son-in-law. Patient daughter is concerned her mother's care.    Social Worker assessment / plan:    CSW spoke with the patients daughter over the phone. Patients daughter stated that she is a Patent attorney for some of the senior care facilities in the area. She stated that she does not like how some of the residents of these facilities are treated. At this time the patients daughter is declining skilled nursing. She is familiar with the process. She believes that her mother can receive better care at home with home health.   CSW spoke with patients daughter about home health and explained how it is set up. CSW referred the patients daughter to speak with case management. CSW called and stated that the patients daughter wanted to speak with her.   Employment status:  Retired Nurse, adult PT  Recommendations:  [place] / Referral to community resources:  [place]  Patient/Family's Response to care:  Patients family is supportive of the patient returning home with home health. They have support to help the patient rehab at home.   Patient/Family's Understanding of and Emotional Response to Diagnosis, Current Treatment, and Prognosis:  Daughter very adamant about her mothers care and how she wants to proceed with it.   Emotional Assessment Appearance:  Appears stated age Attitude/Demeanor/Rapport:  Unable to Assess Affect (typically observed):  Unable to Assess Orientation:  Oriented to Self Alcohol / Substance use:  Not Applicable Psych involvement (Current and /or in the community):  No (Comment)  Discharge Needs  Concerns to be addressed:  Decision making concerns Readmission within the last 30 days:  No Current discharge risk:  Dependent with Mobility Barriers to Discharge:  No Barriers Identified   Marci Polito B Socrates Cahoon, LCSWA 05/09/2018, 4:03 PM

## 2018-05-09 NOTE — Progress Notes (Signed)
Physical Therapy Treatment Patient Details Name: Maureen Mendez MRN: [id number] DOB: 1929/09/02 Today's Date: 05/09/2018    History of Present Illness Pt is an 85 y.o. female admitted 05/07/18 with SOB; worked up for CHF exacerbation. PMH includes CKD IV, stroke, CHF, COPD, asthma, afib. Of note, recent admission for COPD.    PT Comments    Pt with increased lethargy this session, requiring totalA(+2) for mobility. Pt required max, multimodal cues for arousal, opening eyes with mumbled verbalizations, but minimal participation.     Follow Up Recommendations  SNF;Supervision for mobility/OOB     Equipment Recommendations  (TBD)    Recommendations for Other Services       Precautions / Restrictions Precautions Precautions: Fall Precaution Comments: On 2L O2 [name] Restrictions Weight Bearing Restrictions: No    Mobility  Bed Mobility Overal bed mobility: Needs Assistance Bed Mobility: Rolling;Sidelying to Sit Rolling: Total assist Sidelying to sit: Total assist;+2 for physical assistance       General bed mobility comments: zero sitting balance  Transfers Overall transfer level: Needs assistance Equipment used: None Transfers: Lateral/Scoot Transfers          Lateral/Scoot Transfers: Total assist;From elevated surface General transfer comment: TotalA+2 to scoot to drop arm chair with bed pad, pt opening eyes and mumbling words, but minimally participating despite max, multimodal cues  Ambulation/Gait                 Stairs             Wheelchair Mobility    Modified Rankin (Stroke Patients Only)       Balance Overall balance assessment: Needs assistance Sitting-balance support: Single extremity supported(not using BUEs for support at EOB with cues.) Sitting balance-Leahy Scale: Zero                                      Cognition Arousal/Alertness: Lethargic Behavior During Therapy: Flat affect Overall Cognitive Status: No  family/caregiver present to determine baseline cognitive functioning                                 General Comments: Pt unable to follow commands due to lethargy. Unable to see pt perform functional tasks as she did minimally aroused to stimulus. Mumbled speech      Exercises      General Comments General comments (skin integrity, edema, etc.): Pt difficult to arouse. NT alerted on how pt transferred to recliner      Pertinent Vitals/Pain Pain Assessment: Faces Pain Score: 0-No pain Faces Pain Scale: No hurt Pain Intervention(s): Monitored during session    Home Living Family/patient expects to be discharged to:: Skilled nursing facility               Additional Comments: Poor historian and too lethargic to answer questions today.    Prior Function Level of Independence: Needs assistance(unable to obtain PTA.)      Comments: unable to obtain PTA due to poor historian and too lethargic to perform   PT Goals (current goals can now be found in the care plan section) Acute Rehab PT Goals Patient Stated Goal: None stated PT Goal Formulation: Patient unable to participate in goal setting Time For Goal Achievement: 05/22/18 Progress towards PT goals: Not progressing toward goals - comment(Limited by lethargy)    Frequency    Min 2X/week  PT Plan Current plan remains appropriate    Co-evaluation              AM-PAC PT "6 Clicks" Mobility   Outcome Measure  Help needed turning from your back to your side while in a flat bed without using bedrails?: Total Help needed moving from lying on your back to sitting on the side of a flat bed without using bedrails?: Total Help needed moving to and from a bed to a chair (including a wheelchair)?: Total Help needed standing up from a chair using your arms (e.g., wheelchair or bedside chair)?: Total Help needed to walk in hospital room?: Total Help needed climbing 3-5 steps with a railing? : Total 6  Click Score: 6    End of Session Equipment Utilized During Treatment: Oxygen Activity Tolerance: Patient limited by lethargy Patient left: in chair;with call bell/phone within reach;with chair alarm set Nurse Communication: Mobility status PT Visit Diagnosis: Other abnormalities of gait and mobility (R26.89);Muscle weakness (generalized) (M62.81)     Time: 0981-1914 PT Time Calculation (min) (ACUTE ONLY): 25 min  Charges:  $Therapeutic Activity: 8-22 mins                    Mabeline Caras, PT, DPT Acute Rehabilitation Services  Pager [phone] Office [phone]  Derry Lory 05/09/2018, 12:20 PM

## 2018-05-09 NOTE — Progress Notes (Signed)
Physical Therapy Treatment Patient Details Name: Maureen Mendez MRN: [id number] DOB: Sep 17, 1929 Today's Date: 05/09/2018    History of Present Illness Pt is an 85 y.o. female admitted 05/07/18 with SOB; worked up for CHF exacerbation. PMH includes CKD IV, stroke, CHF, COPD, asthma, afib. Of note, recent admission for COPD.    PT Comments    Pt seen for additional session, initially seeming more alert while seated in recliner. Although eyes open and pt verbalizing, pt with minimal participation. Required totalA(+2) for lateral transfer from supine in recliner to bed. Once in bed, pt keeping eyes closed; repositioned and pillows placed to pad contact between BLE bony prominences. According to SW note, daughter declining SNF, requesting HHPT services.    Follow Up Recommendations  SNF;Supervision for mobility/OOB(family declining SNF)     Equipment Recommendations  (TBD)    Recommendations for Other Services  Palliative consult (MD aware)     Precautions / Restrictions Precautions Precautions: Fall Precaution Comments: On 2L O2 [place] Restrictions Weight Bearing Restrictions: No    Mobility  Bed Mobility Overal bed mobility: Needs Assistance Bed Mobility: Rolling;Sidelying to Sit Rolling: Total assist Sidelying to sit: Total assist;+2 for physical assistance       General bed mobility comments: Pt preferring side lying once returned to bed; pillows placed to pad bony prominences between knees and medial malleoli. Pt grimacing with attempt to reposition neck, pillow placed for comfort   Transfers Overall transfer level: Needs assistance   Transfers: Lateral/Scoot Transfers           General transfer comment: TotalA (+2) to perform lateral scoot transfer from pt supine in recliner to bed with blanket/pad  Ambulation/Gait                 Stairs             Wheelchair Mobility    Modified Rankin (Stroke Patients Only)       Balance                                             Cognition Arousal/Alertness: Lethargic Behavior During Therapy: Flat affect Overall Cognitive Status: No family/caregiver present to determine baseline cognitive functioning                                 General Comments: Pt with more verbalizations although still mumbled and difficult to understand. Not following commands, minimal eye opening despite max stimulation      Exercises      General Comments        Pertinent Vitals/Pain Pain Assessment: Faces Faces Pain Scale: Hurts a little bit Pain Location: Generalized with neck repositioning Pain Descriptors / Indicators: Grimacing Pain Intervention(s): Monitored during session    Home Living                      Prior Function            PT Goals (current goals can now be found in the care plan section) Acute Rehab PT Goals Patient Stated Goal: None stated PT Goal Formulation: Patient unable to participate in goal setting Time For Goal Achievement: 05/22/18 Progress towards PT goals: Not progressing toward goals - comment(Limited by lethargy)    Frequency    Min 2X/week  PT Plan Current plan remains appropriate    Co-evaluation              AM-PAC PT "6 Clicks" Mobility   Outcome Measure  Help needed turning from your back to your side while in a flat bed without using bedrails?: Total Help needed moving from lying on your back to sitting on the side of a flat bed without using bedrails?: Total Help needed moving to and from a bed to a chair (including a wheelchair)?: Total Help needed standing up from a chair using your arms (e.g., wheelchair or bedside chair)?: Total Help needed to walk in hospital room?: Total Help needed climbing 3-5 steps with a railing? : Total 6 Click Score: 6    End of Session Equipment Utilized During Treatment: Oxygen Activity Tolerance: Patient limited by lethargy Patient left: in bed;with call  bell/phone within reach;with bed alarm set;with nursing/sitter in room Nurse Communication: Mobility status PT Visit Diagnosis: Other abnormalities of gait and mobility (R26.89);Muscle weakness (generalized) (M62.81)     Time: 7356-7014 PT Time Calculation (min) (ACUTE ONLY): 11 min  Charges:  $Therapeutic Activity: 8-22 mins                    Mabeline Caras, PT, DPT Acute Rehabilitation Services  Pager [phone] Office [place] 05/09/2018, 4:45 PM

## 2018-05-10 DIAGNOSIS — J9601 Acute respiratory failure with hypoxia: Secondary | ICD-10-CM | POA: Diagnosis present

## 2018-05-10 DIAGNOSIS — I480 Paroxysmal atrial fibrillation: Secondary | ICD-10-CM

## 2018-05-10 DIAGNOSIS — Z515 Encounter for palliative care: Secondary | ICD-10-CM

## 2018-05-10 DIAGNOSIS — Z7189 Other specified counseling: Secondary | ICD-10-CM

## 2018-05-10 LAB — BASIC METABOLIC PANEL
Anion gap: 13 (ref 5–15)
BUN: 62 mg/dL — ABNORMAL HIGH (ref 8–23)
CO2: 17 mmol/L — ABNORMAL LOW (ref 22–32)
Calcium: 8.5 mg/dL — ABNORMAL LOW (ref 8.9–10.3)
Chloride: 108 mmol/L (ref 98–111)
Creatinine, Ser: 3 mg/dL — ABNORMAL HIGH (ref 0.44–1.00)
GFR, EST AFRICAN AMERICAN: 16 mL/min — AB (ref >60)
GFR, EST NON AFRICAN AMERICAN: 14 mL/min — AB (ref >60)
Glucose, Bld: 124 mg/dL — ABNORMAL HIGH (ref 70–99)
Potassium: 4.8 mmol/L (ref 3.5–5.1)
Sodium: 138 mmol/L (ref 135–145)

## 2018-05-10 LAB — PREALBUMIN: PREALBUMIN: 8.2 mg/dL — AB (ref 18–38)

## 2018-05-10 MED ORDER — METOLAZONE 5 MG PO TABS: 5.0000 mg | ORAL_TABLET | Freq: Every day | ORAL | Status: DC

## 2018-05-10 MED ORDER — SODIUM CHLORIDE 0.9 % IV SOLN
INTRAVENOUS | Status: AC
  Administered 2018-05-10: 03:00:00 via INTRAVENOUS

## 2018-05-10 MED ORDER — FUROSEMIDE 10 MG/ML IJ SOLN
80.0000 mg | Freq: Three times a day (TID) | INTRAMUSCULAR | Status: DC
  Administered 2018-05-10 – 2018-05-11 (×3): 80 mg via INTRAVENOUS
  Filled 2018-05-10 (×3): qty 8

## 2018-05-10 NOTE — Plan of Care (Signed)
  Problem: Education: Goal: Ability to demonstrate management of disease process will improve Outcome: Progressing   Problem: Clinical Measurements: Goal: Respiratory complications will improve Outcome: Progressing   Problem: Activity: Goal: Risk for activity intolerance will decrease Outcome: Progressing   Problem: Nutrition: Goal: Adequate nutrition will be maintained Outcome: Progressing

## 2018-05-10 NOTE — Progress Notes (Signed)
Pt seen. Chart reviewed. Pt unable to speak for herself currently 2/2 lethargy and AMS. Called pt's dtr, Ms Norville Haggard and arranged mtg for 05/10/2018 at 2pm. Ms Norville Haggard did not arrive to the unit as of 3pm.  Palliative medicine to continue and reach out to family  Thank you,  Romona Curls, ANP

## 2018-05-10 NOTE — Consult Note (Signed)
Consultation Note Date: 05/10/2018   Patient Name: Maureen Mendez  DOB: 1929/11/29  MRN: [id number]  Age / Sex: 85 y.o., female  PCP: Patient, No Pcp Per Referring Physician: Cristy Folks, MD  Reason for Consultation: Establishing goals of care and Psychosocial/spiritual support  HPI/Patient Profile: 85 y.o. female  with past medical history of COPD, GERD, atrial fib, CVA, chronic kidney disease stage IV, CHF with EF 35% admitted on 05/07/2018 with worsening lethargy as well as shortness of breath.  Consult ordered for goals of care.   Clinical Assessment and Goals of Care: Patient seen, chart reviewed.  Met with patient's daughter and son-in-law, Maureen Mendez and Maureen Mendez. Patient is slightly more alert today than she was yesterday although she has spent the majority of the day sleeping.  She did eat breakfast according to CNA but had to be fed.  She declined to eat lunch  Introduced palliative medicine services as an additional resource and source of support.  Patient's daughter states that her mother has only been living with her for 30 days.  Prior to that she was living in her home in [place].  Apparently her mother was found down while in the home in [place], admitted to the hospital and Adult Protective Services of [place] are now involved.  This is an active case per patient's daughter.  There is a court date scheduled for 05/13/2018.  Patient's daughter, Maureen Mendez, is hopeful that she will be declared legal guardian for her mother.  Crystal and her brother were legally adopted by Mrs. Garmon, she has 1 biological son.  Daughter states that she does not know really what her mother's baseline is prior to coming into the hospital. She did walk" a little bit", was able to feed herself, but she has been noticing significant weight loss over the past year.  She states her mother's house clearly  represented her inability to care for herself.   Emergency contact listed is her daughter, Maureen Mendez at [phone]- [phone].  As noted above, there is an active APS case from [facility] on Mrs. Speranza scheduled for Tuesday, 05/13/2018.  Patient has another adopted son as well as biological son  Daughter did not know a lot about her mother's health so we did spend a lot of time discussing her underlying kidney disease stage IV, however her creatinine is worsening as well as her coexisting congestive heart failure with EF of 35% and how this could manifest itself in terms of shortness of breath, lethargy, failure to thrive.  Introduced the concept of [place]. We discussed full code versus DNR.  Daughter states she is a Marine scientist and familiar with those terms.  She wishes her mother to remain a full code for now.  She does share throughout her life, her mother has always stated she would not want to be put on "breathing machine", or have hemodialysis.  She also states her mother has been talking to people that are deceased prior to admission.  I did recommend DNR given her  underlying multisystem frailty    SUMMARY OF RECOMMENDATIONS   Full code [employer] Choices for [employer]oving People booklet given as well as MOST form to help guide family meeting discussions going forward Patient and family would clearly benefit from further goals of care discussion.  Recommend community based palliative medicine after DC.  Those services can be accessed through [facility] at [phone] or [facility] palliative medicine division at [phone] While in the hospital, palliative medicine to continue to support patient and family  Initially, pt's dtr stated no nursing home; she does admit if she remains this lethargic she would not be able to care for her at home  Code Status/Advance Care Planning:  Full code    Palliative Prophylaxis:   Aspiration, Bowel Regimen, Delirium  Protocol, Eye Care, Frequent Pain Assessment, Oral Care and Turn Reposition  Additional Recommendations (Limitations, Scope, Preferences):  Full Scope Treatment  Psycho-social/Spiritual:   Desire for further Chaplaincy support:no  Additional Recommendations: Referral to Community Resources   Prognosis:   Unable to determine  Discharge Planning: To Be Determined      Primary Diagnoses: Present on Admission: . CAD (coronary artery disease) . CVA (cerebral vascular accident) ([place]) . Elevated troponin . CKD (chronic kidney disease) stage 4, GFR 15-29 ml/min (HCC) . Chronic systolic CHF (congestive heart failure) ([place]) . Smoker . Paroxysmal A-fib ([place]) . Acute respiratory failure with hypoxia ([place])   I have reviewed the medical record, interviewed the patient and family, and examined the patient. The following aspects are pertinent.  Past Medical History:  Diagnosis Date  . Asthma   . CHF (congestive heart failure) ([place])   . COPD (chronic obstructive pulmonary disease) ([place])   . GERD (gastroesophageal reflux disease)   . High cholesterol   . Renal disorder    Social History   Socioeconomic History  . Marital status: Single    Spouse name: Not on file  . Number of children: Not on file  . Years of education: Not on file  . Highest education level: Not on file  Occupational History  . Not on file  Social Needs  . Financial resource strain: Not on file  . Food insecurity:    Worry: Not on file    Inability: Not on file  . Transportation needs:    Medical: Not on file    Non-medical: Not on file  Tobacco Use  . Smoking status: Current Every Day Smoker    Packs/day: 1.00    Types: Cigarettes  . Smokeless tobacco: Never Used  Substance and Sexual Activity  . Alcohol use: Not Currently  . Drug use: Not Currently  . Sexual activity: Not on file  Lifestyle  . Physical activity:    Days per week: Not on file    Minutes per session: Not on file  . Stress: Not  on file  Relationships  . Social connections:    Talks on phone: Not on file    Gets together: Not on file    Attends religious service: Not on file    Active member of club or organization: Not on file    Attends meetings of clubs or organizations: Not on file    Relationship status: Not on file  Other Topics Concern  . Not on file  Social History Narrative  . Not on file   History reviewed. No pertinent family history. Scheduled Meds: . apixaban  2.5 mg Oral BID  . aspirin EC  81 mg Oral Daily  .  atorvastatin  80 mg Oral QPM  . calcitRIOL  0.5 mcg Oral Once per day on Mon Tue Wed Thu Fri  . feeding supplement (ENSURE ENLIVE)  237 mL Oral BID BM  . furosemide  80 mg Intravenous TID  . mouth rinse  15 mL Mouth Rinse BID  . metoprolol tartrate  25 mg Oral QID  . multivitamin with minerals  1 tablet Oral Daily  . nicotine  14 mg Transdermal Daily  . pantoprazole  40 mg Oral Daily   Continuous Infusions: PRN Meds:.acetaminophen, diclofenac sodium, docusate sodium, guaiFENesin-dextromethorphan, hydrOXYzine, ipratropium, levalbuterol, polyethylene glycol, polyvinyl alcohol Medications Prior to Admission:  Prior to Admission medications   Medication Sig Start Date End Date Taking? Authorizing Provider  acetaminophen (TYLENOL) 325 MG tablet Take 2 tablets (650 mg total) by mouth every 6 (six) hours as needed for mild pain (or Fever >/= 101). Patient taking differently: Take 325 mg by mouth every 6 (six) hours as needed for mild pain (or Fever >/= 101).  04/10/18  Yes Rizwan, Eunice Blase, MD  albuterol (PROVENTIL HFA;VENTOLIN HFA) 108 (90 Base) MCG/ACT inhaler Inhale 2 puffs into the lungs every 6 (six) hours as needed for wheezing or shortness of breath. 04/10/18  Yes Debbe Odea, MD  aspirin EC 81 MG tablet Take 81 mg by mouth daily.   Yes [provider]  atorvastatin (LIPITOR) 80 MG tablet Take 80 mg by mouth every evening.   Yes [provider]  calcitRIOL (ROCALTROL)  0.5 MCG capsule Take 0.5 mcg by mouth See admin instructions. Monday-Friday   Yes [provider]  diclofenac sodium (VOLTAREN) 1 % GEL Apply 2 g topically 4 (four) times daily as needed (pain).   Yes [provider]  docusate sodium (COLACE) 100 MG capsule Take 100 mg by mouth 2 (two) times daily as needed for mild constipation.   Yes [provider]  ELIQUIS 2.5 MG TABS tablet Take 2.5 mg by mouth daily. 02/19/18  Yes [provider]  guaiFENesin-dextromethorphan (ROBITUSSIN DM) 100-10 MG/5ML syrup Take 5 mLs by mouth every 4 (four) hours as needed for cough (chest congestion). 04/10/18  Yes Debbe Odea, MD  hydrOXYzine (ATARAX/VISTARIL) 10 MG tablet Take 10 mg by mouth 3 (three) times daily as needed for anxiety.   Yes [provider]  metoprolol succinate (TOPROL-XL) 25 MG 24 hr tablet Take 25 mg by mouth daily.   Yes [provider]  omeprazole (PRILOSEC) 20 MG capsule Take 20 mg by mouth daily.   Yes [provider]  polyethylene glycol (MIRALAX / GLYCOLAX) packet Take 17 g by mouth daily as needed for mild constipation. 04/10/18  Yes Debbe Odea, MD  potassium chloride (K-DUR,KLOR-CON) 10 MEQ tablet Take 10 mEq by mouth every evening.   Yes [provider]   No Known Allergies Review of Systems  Unable to perform ROS: Mental status change    Physical Exam Vitals signs and nursing note reviewed.  Constitutional:      Comments: Elderly female, lethargic, minimally conversant  HENT:     Head: Normocephalic and atraumatic.     Mouth/Throat:     Mouth: Mucous membranes are dry.  Neck:     Musculoskeletal: Normal range of motion.  Cardiovascular:     Rate and Rhythm: Rhythm irregular.  Pulmonary:     Effort: Pulmonary effort is normal.  Skin:    General: Skin is warm and dry.  Neurological:     Comments: Patient unable to tell me where she  is.  Otherwise unable to test  Psychiatric:     Comments: No overt  agitation otherwise unable to test; patient is  Lethargic When I asked her why she is in the hospital she states "I have had a stroke"     Vital Signs: BP (!) 127/96   Pulse 72   Temp 98.7 F (37.1 C) (Oral)   Resp 18   Ht _0  (1.6 m)   Wt 57.2 kg   SpO2 96%   BMI 22.32 kg/m  Pain Scale: 0-10   Pain Score: 0-No pain   SpO2: SpO2: 96 % O2 Device:SpO2: 96 % O2 Flow Rate: .O2 Flow Rate (L/min): 2 L/min  IO: Intake/output summary:   Intake/Output Summary (Last 24 hours) at 05/10/2018 1655 Last data filed at 05/10/2018 1300 Gross per 24 hour  Intake 368.88 ml  Output 400 ml  Net -31.12 ml    LBM: Last BM Date: 05/08/18 Baseline Weight: Weight: 47.6 kg Most recent weight: Weight: 57.2 kg     Palliative Assessment/Data:   Flowsheet Rows     Most Recent Value  Intake Tab  Referral Department  Hospitalist  Unit at Time of Referral  Med/Surg Unit  Palliative Care Primary Diagnosis  Cardiac  Date Notified  05/09/18  Palliative Care Type  New Palliative care  Reason for referral  Clarify Goals of Care, Psychosocial or Spiritual support  Date of Admission  05/07/18  Date first seen by Palliative Care  05/10/18  # of days Palliative referral response time  1 Day(s)  # of days IP prior to Palliative referral  2  Clinical Assessment  Palliative Performance Scale Score  40%  Pain Max last 24 hours  Not able to report  Pain Min Last 24 hours  Not able to report  Dyspnea Max Last 24 Hours  Not able to report  Dyspnea Min Last 24 hours  Not able to report  Nausea Max Last 24 Hours  Not able to report  Nausea Min Last 24 Hours  Not able to report  Anxiety Max Last 24 Hours  Not able to report  Anxiety Min Last 24 Hours  Not able to report  Other Max Last 24 Hours  Not able to report  Psychosocial & Spiritual Assessment  Palliative Care Outcomes  Patient/Family meeting held?  Yes  Who was at the meeting?  pt's dtr and SIL  Palliative Care Outcomes  Provided psychosocial  or spiritual support, Provided advance care planning  Palliative Care follow-up planned  Yes, Facility      Time In: 1600 Time Out: 1710 Time Total: 70 min Greater than 50%  of this time was spent counseling and coordinating care related to the above assessment and plan.  Signed by: Dory Horn, NP   Please contact Palliative Medicine Team phone at [phone] for questions and concerns.  For individual provider: See Shea Evans

## 2018-05-10 NOTE — Progress Notes (Signed)
PROGRESS NOTE    Maureen Mendez  LYY:[id number] DOB: [birth year] DOA: 05/07/2018 PCP: Patient, No Pcp Per   Brief Narrative:  85 year old with past medical history relevant for stage IV chronic kidney disease, history of CVA and apical thrombus as well as and paroxysmal atrial fibrillation on apixaban, chronic systolic heart failure EF of 35 to 40% with grade 2 diastolic dysfunction by echo on 04/08/2018, active smoker, who presented on 05/07/2018 with shortness of breath that was felt to be multifactorial.   Assessment & Plan:   Principal Problem:   Acute respiratory failure with hypoxia ([place]) Active Problems:   CAD (coronary artery disease)   CVA (cerebral vascular accident) ([place])   Elevated troponin   CKD (chronic kidney disease) stage 4, GFR 15-29 ml/min (HCC)   Chronic systolic CHF (congestive heart failure) (HCC)   Smoker   Paroxysmal A-fib (HCC)   Acute on chronic congestive heart failure (HCC)   Protein-calorie malnutrition, severe   #) Shortness of breath: It is unclear if this patient had a COPD exacerbation or an acute on chronic systolic heart failure exacerbation.  Clinically currently she appears to be fairly well and stable.  She is requiring a small amount of oxygen.  Is not clear whether she responded to diuresis versus 1 dose of steroids and bronchodilators. -Wean oxygen as tolerated  #) Acute metabolic encephalopathy: Patient was noted to have some encephalopathy and be fairly altered and quite somnolent.  This appears to be improving.  She apparently received 1 dose of lorazepam on 05/08/2018 overnight which was attributed to this.  Unfortunately patient's been touching her medications and not eating - We will avoid sedating medications -Palliative care consult, plans discussed goals of care with family  #) Acute on chronic systolic heart failure exacerbation: Possibly contributing to her shortness of breath.  Her EF is quite low.  Additionally chest x-ray showed small  pleural effusions and pulmonary vascular congestion.  She does not quite have the typical symptoms.  She was given judicious diuresis by heart failure. -Heart failure following, appreciate recommendations -Strict ins and outs, weigh daily - Continue metoprolol tartrate 25 mg 4 times daily, this was fractionated by advanced heart failure next -Patient cannot tolerate ACE inhibitor due to AKI  #) Active tobacco abuse/COPD: Currently patient is not wheezing. -Continue short-acting bronchodilators   #) History of CVA/left apical thrombus/paroxysmal atrial fibrillation: -Beta-blocker per below -Continue apixaban 2.5 mg twice daily  #) Stage IV CKD: This appears to be relatively stable.  It is not clear if patient is a dialysis candidate. -Hold nephrotoxins -Continue calcitriol 3 times a week  #) Hypertension/hyperlipidemia: -Continue aspirin 81 mg -Continue atorvastatin 80 mg daily -Continue beta-blocker  Fluids: Restrict Electrolytes: Monitor and supplement Nutrition: Heart healthy diet next Prophylaxis: On apixaban  Disposition: Pending discussion of goals of care and improvement in respiratory status  Full code   Consultants:   Cardiology  Palliative care  Procedures:  none  Antimicrobials:   none    Subjective: This morning the patient is awake and arousable but not very willing to participate in the exam or complain about anything.  She denies any abdominal pain, nausea, vomiting, diarrhea, chest pain.  She apparently is not been taking her medication that is been pouching food.  Objective: Vitals:   05/09/18 1045 05/09/18 1213 05/09/18 2047 05/10/18 0630  BP: (!) 114/103 (!) 117/95 (!) 124/97 (!) 141/99  Pulse: 71 93 92 77  Resp: 18 16 18 16   Temp: (!) 97.4 F (36.3  C) (!) 97.5 F (36.4 C) 97.8 F (36.6 C) (!) 97.5 F (36.4 C)  TempSrc: Oral Oral  Oral  SpO2: 94%  100% 96%  Weight:    57.2 kg  Height:        Intake/Output Summary (Last 24 hours) at  05/10/2018 0844 Last data filed at 05/10/2018 0600 Gross per 24 hour  Intake 728.88 ml  Output 400 ml  Net 328.88 ml   Filed Weights   05/08/18 0520 05/09/18 0418 05/10/18 0630  Weight: 55.2 kg 56.5 kg 57.2 kg    Examination:  General exam: Appears calm and comfortable  Respiratory system: No increased work of breathing, diminished lung sounds at bases, no crackles Cardiovascular system: Regular rate and rhythm, no murmurs Gastrointestinal system: Soft, nondistended, no rebound or guarding, plus bowel sounds Central nervous system: Alert but oriented only to self.  Does not respond to questions or participate in exam, Extremities: No lower extremity edema Skin: No rashes over visible skin Psychiatry: Unable to assess due to medical condition    Data Reviewed: I have personally reviewed following labs and imaging studies  CBC: Recent Labs  Lab 05/07/18 0857 05/08/18 0430  WBC 8.1 9.5  HGB 12.0 12.7  HCT 39.2 39.7  MCV 95.6 91.9  PLT 91* 951*   Basic Metabolic Panel: Recent Labs  Lab 05/07/18 0857 05/08/18 0430 05/09/18 0712 05/10/18 0405  NA 139 138 135 138  K 5.5* 5.0 5.0 4.8  CL 114* 112* 107 108  CO2 10* 12* 18* 17*  GLUCOSE 85 88 134* 124*  BUN 47* 50* 59* 62*  CREATININE 2.63* 2.72* 2.98* 3.00*  CALCIUM 9.2 8.5* 8.4* 8.5*   GFR: Estimated Creatinine Clearance: 11.1 mL/min (A) (by C-G formula based on SCr of 3 mg/dL (H)). Liver Function Tests: No results for input(s): AST, ALT, ALKPHOS, BILITOT, PROT, ALBUMIN in the last 168 hours. No results for input(s): LIPASE, AMYLASE in the last 168 hours. No results for input(s): AMMONIA in the last 168 hours. Coagulation Profile: No results for input(s): INR, PROTIME in the last 168 hours. Cardiac Enzymes: Recent Labs  Lab 05/07/18 1815 05/07/18 2328 05/08/18 0430  TROPONINI 0.10* 0.11* 0.10*   BNP (last 3 results) No results for input(s): PROBNP in the last 8760 hours. HbA1C: No results for input(s):  HGBA1C in the last 72 hours. CBG: Recent Labs  Lab 05/08/18 2135  GLUCAP 167*   Lipid Profile: No results for input(s): CHOL, HDL, LDLCALC, TRIG, CHOLHDL, LDLDIRECT in the last 72 hours. Thyroid Function Tests: No results for input(s): TSH, T4TOTAL, FREET4, T3FREE, THYROIDAB in the last 72 hours. Anemia Panel: No results for input(s): VITAMINB12, FOLATE, FERRITIN, TIBC, IRON, RETICCTPCT in the last 72 hours. Sepsis Labs: No results for input(s): PROCALCITON, LATICACIDVEN in the last 168 hours.  No results found for this or any previous visit (from the past 240 hour(s)).       Radiology Studies: No results found.      Scheduled Meds: . apixaban  2.5 mg Oral BID  . aspirin EC  81 mg Oral Daily  . atorvastatin  80 mg Oral QPM  . calcitRIOL  0.5 mcg Oral Once per day on Mon Tue Wed Thu Fri  . feeding supplement (ENSURE ENLIVE)  237 mL Oral BID BM  . mouth rinse  15 mL Mouth Rinse BID  . metoprolol tartrate  25 mg Oral QID  . multivitamin with minerals  1 tablet Oral Daily  . nicotine  14 mg Transdermal Daily  .  pantoprazole  40 mg Oral Daily   Continuous Infusions:   LOS: 1 day    Time spent: [place], MD [facility]  If 7PM-7AM, please contact night-coverage [email or web address] Password [facility] 05/10/2018, 8:44 AM

## 2018-05-10 NOTE — Progress Notes (Addendum)
Nutrition Follow-up  DOCUMENTATION CODES:  Severe malnutrition in context of chronic illness  INTERVENTION:  To promote oral intake, Would consider liberalization of pt diet by dc of salt restrictions  Will continue oral supplements for now as these were only just ordered  RD to follow and monitor meals intakes and outcome of [place] conversation  Broached topic of feeding tube with patient.   NUTRITION DIAGNOSIS:  Severe Malnutrition related to inadequate oral intake, lethargy/confusions and underlying chronic illnesses (COPD, HF, CKD) as evidenced by severe muscle/fat depletions.   Ongoing GOAL:  Patient will meet greater than or equal to 90% of their needs  Not believed to be met  MONITOR:  PO intake, Supplement acceptance, Labs, Weight trends, Skin, I & O's  REASON FOR ASSESSMENT:  Consult Assessment of nutrition requirement/status  ASSESSMENT:  85 y/o female PMHx CKD4, Recent CVA, CHF, COPD, GERD, Afib, ongoing tobacco abuse. Recent admission 1/19-1/23 for COPD exacerbation. Pt presents w/ increasing weakness and SOB x3 days. Has had associated poor appetite. Workup significant for proBNP >3K and hypokalemia. Admitted for management.   Pt had been assessed and diagnosed w/ severe malnutrition by RD on morning of 2/20. For information regarding patients diet habits PTA, please see that note.    RD Reconsulted for assessment. Per consulting MDs note, MD questioning if cortrak is warranted.   Today, pt is exceedingly lethargic. She only briefly opens eyes and gives short, brief answers to questions. RD asked why she wasn't eating. She says "I am eating great..just not this morning". She does not give any reason as to why she didn't eat well this morning. When asked how she was drinking the Ensure supplements, she says "I drink all of them when I have them". RD asked why she wasn't accepted meds. She says "I dont need them"  RD spoke care teams concerns regarding her refusal of  meds/food. RD put forth idea of small bore feeding tube to provide her nutrition and medications. She only grimaced to hearing this and did not offer any verbal response. There is currently a palliative consult ordered to discuss [place] w/ rest of family  Nursing reports pt only ate 5 bites this AM and that she has not consumed any ensure. Yesterday, pt is documented as eating 100% of lunch. She is also documented as eating 80% of her breakfast on 8/20. No other existing meal documentation.   W/ sternal rub, nurse was able to arouse pt and asked if pt would drink Ensure in form of shake - pt said she would. Nursing staff have not noticed any apparent food prefs of the patient which RD could build on. They have not noted any n/v/c/d or other GI issues that could be impacting the pts desire to eat.  RD to continue to monitor oral intake and outcome of GOC discussion.   Of note, the pts bed weight today reads 92 lbs? Though this is a drastic outlier from all other measurements- accuracy is questionable  Labs: K:4.8, No recent phos. Na: 138 Meds: Glu: 125-135, Prealbumin:8.2, BUN/Ceat: 47/2.63 (admit) -> 62/3.0 (today)  Recent Labs  Lab 05/08/18 0430 05/09/18 0712 05/10/18 0405  NA 138 135 138  K 5.0 5.0 4.8  CL 112* 107 108  CO2 12* 18* 17*  BUN 50* 59* 62*  CREATININE 2.72* 2.98* 3.00*  CALCIUM 8.5* 8.4* 8.5*  GLUCOSE 88 134* 124*   Diet Order:   Diet Order            DIET  DYS 2 Room service appropriate? Yes; Fluid consistency: Thin; Fluid restriction: 1500 mL Fluid  Diet effective now             EDUCATION NEEDS:  Education needs have been addressed  Skin:  Skin Assessment: Reviewed RN Assessment  Last BM:  2/20  Height:  Ht Readings from Last 1 Encounters:  05/07/18 _0  (1.6 m)   Weight:  Wt Readings from Last 1 Encounters:  05/10/18 57.2 kg   Wt Readings from Last 10 Encounters:  05/10/18 57.2 kg  04/07/18 59 kg   Ideal Body Weight:  52.3 kg  BMI:  Body mass  index is 22.32 kg/m.  Estimated Nutritional Needs:  Kcal:  1450-1650 Protein:  60-75 grams Fluid:  > 1.4 L  Burtis Junes RD, LDN, CNSC Clinical Nutrition Available Tues-Sat via Pager: [phone] 05/10/2018 12:55 PM

## 2018-05-10 NOTE — Progress Notes (Signed)
Progress Note    Maureen Mendez  XNA:[id number] DOB: November 05, 1929  DOA: 05/07/2018 PCP: Patient, No Pcp Per    Brief Narrative:   Chief complaint: Shortness of breath  Medical records reviewed and are as summarized below:  Maureen Mendez is an 85 y.o. female with pmh of combined systolic and diastolic CHF with last EF 35 to 40% with grade 2 diastolic dysfunction, PAF, tobacco abuse, COPD, CKD stage IV, and history of CVAs; who presents with shortness of breath.  Multifactorial in nature with CHF exacerbation and/or COPD exacerbation  Assessment/Plan:   Principal Problem:   Acute respiratory failure with hypoxia ([place]) Active Problems:   CAD (coronary artery disease)   CVA (cerebral vascular accident) ([place])   Elevated troponin   CKD (chronic kidney disease) stage 4, GFR 15-29 ml/min (HCC)   Chronic systolic CHF (congestive heart failure) (HCC)   Smoker   Paroxysmal A-fib (HCC)   Acute on chronic congestive heart failure (HCC)   Protein-calorie malnutrition, severe  1.  Acute respiratory failure with hypoxia 2/2 combined systolic and diastolic congestive heart failure exacerbation: Patient normally not on oxygen at home, but requiring 2 to 4 L nasal cannula oxygen to maintain O2 saturations.  On admission patient noted to have crackles on physical exam.  Chest x-ray showing signs of pulmonary edema and BNP elevated at 3324.2.  Last known ejection fraction of 35-40% with grade 2 diastolic dysfunction during admission in 03/2018 for suspected COPD exacerbation where BMP 3300.9.  Lasix 20 mg IV twice daily was started, but held on hospital day 2 due to increasing creatinine prior to cardiology evaluation.  Cardiology consulted and recommended one-time dose of Lasix 60 mg IV(patient received a total of 80 mg IV Lasix).  Hospital weights appear inconsistent along with I&O's(nursing staff noted dislodgment of purewick). -Continuous pulse oximetry with nasal cannula oxygen as needed with goal to  wean to room air -Strict I&Os and daily weights -Hold additional diuretics -Fluid restrictions of 1500 ml/day -Appreciate cardiology consultative services, will follow-up for further recommendation  COPD exacerbation: Patient has significant component of COPD and bronchospasm.    On exam patient still has mild wheezing. -Continue substitute of levalbuterol nebs for albuterol with ipratropium nebs as needed shortness of breath/wheezing -Give 40 mg of IV Solu-Medrol x1 dose, reassess in a.m. for need of continuation of IV steroids  Acute encephalopathy: Patient noted to be acutely altered on hospital day 2 over night, and was given Ativan 0.5 mg IV .  Now, currently more lethargic and less interactive than previous day.  Nursing staff report decreased mentation.  ABG revealed pH of 7.327, PCO2 31, PO2 69 on 2 L.  Appears to have metabolic acidosis which likely could be related to diuretics.  Significant signs suspect could be multifactorial given Ativan given and worsening kidney function concern for overdiuresis. -Continue neurochecks -Aspiration precaution -Avoid Ativan and other sedating meds -Soft restraints, if needed  -Palliative care consult -Question need of coretrack/NG tube for meds and enteral nutrition  Acute kidney injury superimposed on chronic kidney disease: Worsening.  Creatinine 2.63-> 2.72-> 2.98.  Baseline creatinine previously noted to be around 2 -2.5.  Given elevated BUN suspecting over diuresis which could be adding to patient's lethargy. -Normal saline IV fluids at 75 mL x5hours overnight. -Recheck kidney function in a.m. -Hold nephrotoxic agents  Elevated troponins: Acute.  Troponins 0.1-> 0.11-> 0.1. Suspect secondary to type II demand ischemia given kidney disease.   Paroxysmal A. fib on anticoagulation  of Eliquis: Heart rates into the 130s, but question breathing treatments. Patient currently treated with Eliquis.  Previous history of significant GI bleed and 2017  just recently restarted on Eliquis and 01/2018 after [facility] admission. -Continue Eliquis, aspirin, and metoprolol  Hyperkalemia: Resolved.  Potassium however noted at the upper limit of normal now. -Continue to monitor  Essential hypertension -Continue metoprolol  -Diuretics on hold  History of CVA related with LV thrombus -Continue aspirin and Eliquis  Dyslipidemia: Stable -Continue atorvastatin  Tobacco abuse: Patient reported to smoke.  Counseled counseled on need to quit smoking. -Nicotine patch   Thrombocytopenia: Acute on chronic.  No reports of bleeding -Continue to monitor  Physical deconditioning: Patient appears globally weak on exam.  Plan for skilled nursing facility placement.  PT and OT having difficulty evaluating due to lethargy -Follow-up PT and OT -Social work consulted  History of GI bleed: In 2017  Body mass index is 22.06 kg/m.   Family Communication/Anticipated D/C date and plan/Code Status   DVT prophylaxis: Eliquis Code Status: Full Code.  Family Communication: No family present at bedside Disposition Plan: SNF   Medical Consultants:    Cardiology, palliative care   Anti-Infectives:    None  Subjective:   Patient less responsive today to questioning.  Overnight nursing reported confusion for which Ativan 0.5 mg given.  Nursing staff report missing oral medications this a.m. due to fear of aspiration.  Objective:    Vitals:   05/09/18 0418 05/09/18 1045 05/09/18 1213 05/09/18 2047  BP: (!) 118/96 (!) 114/103 (!) 117/95 (!) 124/97  Pulse: 84 71 93 92  Resp: 16 18 16 18   Temp: (!) 97.3 F (36.3 C) (!) 97.4 F (36.3 C) (!) 97.5 F (36.4 C) 97.8 F (36.6 C)  TempSrc: Oral Oral Oral   SpO2: 100% 94%  100%  Weight: 56.5 kg     Height:        Intake/Output Summary (Last 24 hours) at 05/10/2018 0106 Last data filed at 05/09/2018 1300 Gross per 24 hour  Intake 480 ml  Output 300 ml  Net 180 ml   Filed  Weights   05/07/18 2034 05/08/18 0520 05/09/18 0418  Weight: 56 kg 55.2 kg 56.5 kg    Exam: Constitutional: Frail elderly female more lethargic, but will respond with repeated stimuli Eyes: Right eyelid close and it appears to be opacified when opened. ENMT: Mucous membranes are moist. Posterior pharynx clear of any exudate or lesions. Hard of hearing.   Neck: normal, supple, no masses, no thyromegaly.  No signs of JVD. Respiratory: Mild wheezes noted on exam, no crackles appreciated today. Cardiovascular: Regular rate and rhythm, no murmurs / rubs / gallops.  No lower extremity edema. 2+ pedal pulses. No carotid bruits.  Abdomen: no tenderness, no masses palpated. No hepatosplenomegaly. Bowel sounds positive.  Musculoskeletal: no clubbing / cyanosis. No joint deformity upper and lower extremities. Good ROM, no contractures. Normal muscle tone.  Skin: no rashes, lesions, ulcers. No induration Neurologic: CN 2-12 grossly intact. Sensation intact, DTR normal. Strength 5/5 in all 4.  Psychiatric: Lethargic and oriented only to person.  Data Reviewed:   I have personally reviewed following labs and imaging studies:  Labs: Labs show the following:   Basic Metabolic Panel: Recent Labs  Lab 05/07/18 0857 05/08/18 0430 05/09/18 0712  NA 139 138 135  K 5.5* 5.0 5.0  CL 114* 112* 107  CO2 10* 12* 18*  GLUCOSE 85 88 134*  BUN 47* 50*  59*  CREATININE 2.63* 2.72* 2.98*  CALCIUM 9.2 8.5* 8.4*   GFR Estimated Creatinine Clearance: 11.2 mL/min (A) (by C-G formula based on SCr of 2.98 mg/dL (H)). Liver Function Tests: No results for input(s): AST, ALT, ALKPHOS, BILITOT, PROT, ALBUMIN in the last 168 hours. No results for input(s): LIPASE, AMYLASE in the last 168 hours. No results for input(s): AMMONIA in the last 168 hours. Coagulation profile No results for input(s): INR, PROTIME in the last 168 hours.  CBC: Recent Labs  Lab 05/07/18 0857 05/08/18 0430  WBC 8.1 9.5  HGB 12.0  12.7  HCT 39.2 39.7  MCV 95.6 91.9  PLT 91* 100*   Cardiac Enzymes: Recent Labs  Lab 05/07/18 1815 05/07/18 2328 05/08/18 0430  TROPONINI 0.10* 0.11* 0.10*   BNP (last 3 results) No results for input(s): PROBNP in the last 8760 hours. CBG: Recent Labs  Lab 05/08/18 2135  GLUCAP 167*   D-Dimer: No results for input(s): DDIMER in the last 72 hours. Hgb A1c: No results for input(s): HGBA1C in the last 72 hours. Lipid Profile: No results for input(s): CHOL, HDL, LDLCALC, TRIG, CHOLHDL, LDLDIRECT in the last 72 hours. Thyroid function studies: No results for input(s): TSH, T4TOTAL, T3FREE, THYROIDAB in the last 72 hours.  Invalid input(s): FREET3 Anemia work up: No results for input(s): VITAMINB12, FOLATE, FERRITIN, TIBC, IRON, RETICCTPCT in the last 72 hours. Sepsis Labs: Recent Labs  Lab 05/07/18 0857 05/08/18 0430  WBC 8.1 9.5    Microbiology No results found for this or any previous visit (from the past 240 hour(s)).  Procedures and diagnostic studies:  No results found.  Medications:   . apixaban  2.5 mg Oral BID  . aspirin EC  81 mg Oral Daily  . atorvastatin  80 mg Oral QPM  . calcitRIOL  0.5 mcg Oral Once per day on Mon Tue Wed Thu Fri  . feeding supplement (ENSURE ENLIVE)  237 mL Oral BID BM  . mouth rinse  15 mL Mouth Rinse BID  . metoprolol tartrate  25 mg Oral QID  . multivitamin with minerals  1 tablet Oral Daily  . nicotine  14 mg Transdermal Daily  . pantoprazole  40 mg Oral Daily   Continuous Infusions:   LOS: 1 day    A   [facility]   *Please refer to [email or web address], password [email or web address] to get updated schedule on who will round on this patient, as hospitalists switch teams weekly. If 7PM-7AM, please contact night-coverage at [email or web address], password [email or web address] for any overnight needs.

## 2018-05-10 NOTE — Progress Notes (Signed)
Progress Note  Patient Name: Maureen Mendez Date of Encounter: 05/10/2018  Primary Cardiologist: No primary care provider on file. [facility].   Subjective   In review of medical records from consult note, last visit in July 2019 was in sinus rhythm at the time with a EF 25% in 2017 with apical infarct thromboembolic CVA.  Declined defibrillator in the past.  Apical thrombus in the past.  Has a history of paroxysmal atrial fibrillation and duodenal bleed on Coumadin.  Amiodarone was stopped because of liver toxicity.  She had a history of noncompliance with medical therapy.  At some point she was put on Eliquis for age and renal function.  She came into the hospital with complaints of worsening shortness of breath over the past few days.  Creatinine 2.6, baseline, mildly elevated flat troponin demand ischemia.  She was in atrial flutter with variable AV conduction, heart rate 121 bpm.  Currently she is sleeping but arousable.  Inpatient Medications    Scheduled Meds: . apixaban  2.5 mg Oral BID  . aspirin EC  81 mg Oral Daily  . atorvastatin  80 mg Oral QPM  . calcitRIOL  0.5 mcg Oral Once per day on Mon Tue Wed Thu Fri  . feeding supplement (ENSURE ENLIVE)  237 mL Oral BID BM  . mouth rinse  15 mL Mouth Rinse BID  . metoprolol tartrate  25 mg Oral QID  . multivitamin with minerals  1 tablet Oral Daily  . nicotine  14 mg Transdermal Daily  . pantoprazole  40 mg Oral Daily   Continuous Infusions:  PRN Meds: acetaminophen, diclofenac sodium, docusate sodium, guaiFENesin-dextromethorphan, hydrOXYzine, ipratropium, levalbuterol, polyethylene glycol, polyvinyl alcohol   Vital Signs    Vitals:   05/09/18 2047 05/10/18 0630 05/10/18 0730 05/10/18 1130  BP: (!) 124/97 (!) 141/99 (!) 140/105 (!) 129/100  Pulse: 92 77 86 86  Resp: 18 16    Temp: 97.8 F (36.6 C) (!) 97.5 F (36.4 C)    TempSrc:  Oral    SpO2: 100% 96%    Weight:  57.2 kg    Height:        Intake/Output  Summary (Last 24 hours) at 05/10/2018 1158 Last data filed at 05/10/2018 0730 Gross per 24 hour  Intake 488.88 ml  Output 600 ml  Net -111.12 ml   Last 3 Weights 05/10/2018 05/09/2018 05/08/2018  Weight (lbs) 126 lb 124 lb 9 oz 121 lb 11.1 oz  Weight (kg) 57.153 kg 56.5 kg 55.2 kg      Telemetry    Atrial flutter heart rate in the 70s to 80s- Personally Reviewed  ECG    No new- Personally Reviewed  Physical Exam   GEN: No acute distress.  Resting fairly comfortably in bed Neck: No JVD Cardiac: RRR, no murmurs, rubs, or gallops.  Respiratory: Clear to auscultation bilaterally. GI: Soft, nontender, non-distended  MS: No edema; No deformity. Neuro:  Nonfocal  Psych: Unable to clearly assess.  After auscultating her, she did open her eyes and turn her head towards me.  Went back to sleep.  Labs    Chemistry Recent Labs  Lab 05/08/18 0430 05/09/18 0712 05/10/18 0405  NA 138 135 138  K 5.0 5.0 4.8  CL 112* 107 108  CO2 12* 18* 17*  GLUCOSE 88 134* 124*  BUN 50* 59* 62*  CREATININE 2.72* 2.98* 3.00*  CALCIUM 8.5* 8.4* 8.5*  GFRNONAA 15* 14* 14*  GFRAA 18* 16* 16*  ANIONGAP 14  10 13     Hematology Recent Labs  Lab 05/07/18 0857 05/08/18 0430  WBC 8.1 9.5  RBC 4.10 4.32  HGB 12.0 12.7  HCT 39.2 39.7  MCV 95.6 91.9  MCH 29.3 29.4  MCHC 30.6 32.0  RDW 23.3* 22.9*  PLT 91* 100*    Cardiac Enzymes Recent Labs  Lab 05/07/18 1815 05/07/18 2328 05/08/18 0430  TROPONINI 0.10* 0.11* 0.10*    Recent Labs  Lab 05/07/18 0902  TROPIPOC 0.12*     BNP Recent Labs  Lab 05/07/18 0857  BNP 3,324.2*     DDimer No results for input(s): DDIMER in the last 168 hours.   Radiology    No results found.  Cardiac Studies   Echocardiogram 04/08/2018: EF 35 to 40% with secondary pulmonary hypertension 45 mmHg  Patient Profile     85 y.o. female here with acute on chronic systolic heart failure, paroxysmal atrial fibrillation flutter, chronic kidney disease  stage IV, hypertension, history of apical thrombus on Eliquis, ventricular tachycardia prior refusal of ICD.  EF 25% previously.  Assessment & Plan    Acute on chronic systolic heart failure - In January EF was 35 to 40% with moderate RV dysfunction, originally 25% EF. -On 05/09/2018, yesterday Zaroxolyn was given in addition to her Lasix because weight had increased and she was wheezing on exam.  2.5, low-dose of Zaroxolyn. - Still according to input and output she was +328 mL yesterday.  Meager urine output.  Weight also increased once again.  Creatinine now 3. - I will go ahead and increase Lasix to IV 80 mg 3 times a day.  Per nursing, she was not taking pills.  Obviously with her BUN increasing, we may need to pull back on Lasix soon.  Demand ischemia -Low-level troponin flat secondary to underlying heart failure episode.  This does worsen overall prognosis.   Paroxysmal atrial fibrillation/flutter -Currently rate controlled on Eliquis.  Severe atrial dilatation.  Did not tolerate amiodarone in the past due to liver toxicity.  Chronic kidney disease stage IV - Creatinine at baseline approximately 2.9.  Essential hypertension -Reasonably controlled.  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       For questions or updates, please contact [place] Please consult [email or web address] for contact info under        Signed, Candee Furbish, MD  05/10/2018, 11:58 AM

## 2018-05-10 NOTE — Progress Notes (Signed)
Patients daughter states that she would like her mom to be full code and have all measures done for her.

## 2018-05-11 ENCOUNTER — Inpatient Hospital Stay (HOSPITAL_COMMUNITY): Payer: Medicare Other

## 2018-05-11 DIAGNOSIS — I5022 Chronic systolic (congestive) heart failure: Secondary | ICD-10-CM

## 2018-05-11 LAB — CBC
HCT: 36.3 % (ref 36.0–46.0)
Hemoglobin: 11.8 g/dL — ABNORMAL LOW (ref 12.0–15.0)
MCH: 29.2 pg (ref 26.0–34.0)
MCHC: 32.5 g/dL (ref 30.0–36.0)
MCV: 89.9 fL (ref 80.0–100.0)
Platelets: 95 10*3/uL — ABNORMAL LOW (ref 150–400)
RBC: 4.04 MIL/uL (ref 3.87–5.11)
RDW: 22.2 % — ABNORMAL HIGH (ref 11.5–15.5)
WBC: 13.1 10*3/uL — ABNORMAL HIGH (ref 4.0–10.5)
nRBC: 5.1 % — ABNORMAL HIGH (ref 0.0–0.2)

## 2018-05-11 LAB — COMPREHENSIVE METABOLIC PANEL
ALT: 243 U/L — ABNORMAL HIGH (ref 0–44)
Albumin: 2.6 g/dL — ABNORMAL LOW (ref 3.5–5.0)
Alkaline Phosphatase: 164 U/L — ABNORMAL HIGH (ref 38–126)
Anion gap: 14 (ref 5–15)
BUN: 69 mg/dL — ABNORMAL HIGH (ref 8–23)
CO2: 17 mmol/L — ABNORMAL LOW (ref 22–32)
Calcium: 8.4 mg/dL — ABNORMAL LOW (ref 8.9–10.3)
Chloride: 106 mmol/L (ref 98–111)
Creatinine, Ser: 2.96 mg/dL — ABNORMAL HIGH (ref 0.44–1.00)
GFR calc Af Amer: 16 mL/min — ABNORMAL LOW (ref >60)
Glucose, Bld: 111 mg/dL — ABNORMAL HIGH (ref 70–99)
Potassium: 4.7 mmol/L (ref 3.5–5.1)
Sodium: 137 mmol/L (ref 135–145)
Total Bilirubin: 2 mg/dL — ABNORMAL HIGH (ref 0.3–1.2)
Total Protein: 5.8 g/dL — ABNORMAL LOW (ref 6.5–8.1)

## 2018-05-11 LAB — MAGNESIUM: Magnesium: 2.3 mg/dL (ref 1.7–2.4)

## 2018-05-11 LAB — COMPREHENSIVE METABOLIC PANEL WITH GFR
AST: 183 U/L — ABNORMAL HIGH (ref 15–41)
GFR calc non Af Amer: 14 mL/min — ABNORMAL LOW (ref >60)

## 2018-05-11 LAB — AMMONIA: Ammonia: 29 umol/L (ref 9–35)

## 2018-05-11 MED ORDER — SODIUM BICARBONATE 650 MG PO TABS
650.0000 mg | ORAL_TABLET | Freq: Three times a day (TID) | ORAL | Status: DC
  Administered 2018-05-11 – 2018-05-16 (×16): 650 mg via ORAL
  Filled 2018-05-11 (×17): qty 1

## 2018-05-11 MED ORDER — FUROSEMIDE 20 MG PO TABS
20.0000 mg | ORAL_TABLET | Freq: Every day | ORAL | Status: DC
  Administered 2018-05-12 – 2018-05-16 (×5): 20 mg via ORAL
  Filled 2018-05-11 (×6): qty 1

## 2018-05-11 NOTE — Progress Notes (Signed)
CSW acknowledging consult for "Pt has an active [place] case open with court date 2/25. Does any of her children have ability to make decisions for her?". CSW will have to follow up tomorrow on attempting to reach a representative with [facility] [employer] as the offices are closed today.   CSW will follow up tomorrow.  Laveda Abbe, [place] Clinical Social Worker [phone]

## 2018-05-11 NOTE — Progress Notes (Addendum)
PROGRESS NOTE    Maureen Mendez  [id number]:[id number] DOB: [birth year] DOA: 05/07/2018 PCP: Patient, No Pcp Per   Brief Narrative:  85 year old with past medical history relevant for stage IV chronic kidney disease, history of CVA and apical thrombus as well as and paroxysmal atrial fibrillation on apixaban, chronic systolic heart failure EF of 35 to 40% with grade 2 diastolic dysfunction by echo on 04/08/2018, active smoker, who presented on 05/07/2018 with shortness of breath that was felt to be multifactorial.   Assessment & Plan:   Principal Problem:   Acute respiratory failure with hypoxia ([place]) Active Problems:   CAD (coronary artery disease)   CVA (cerebral vascular accident) ([place])   Elevated troponin   CKD (chronic kidney disease) stage 4, GFR 15-29 ml/min (HCC)   Chronic systolic CHF (congestive heart failure) (HCC)   Smoker   Paroxysmal A-fib (HCC)   Acute on chronic congestive heart failure (HCC)   Protein-calorie malnutrition, severe   Goals of care, counseling/discussion   Palliative care by specialist   #) Shortness of breath: This appears to be improved however it is difficult for patient to give an accurate symptom complex that she is not quite at her baseline. -Wean oxygen as tolerated  #) Acute metabolic encephalopathy: Multifactorial including likely underlying dementia, - We will avoid sedating medications -We will check ammonia -Palliative care consult, has discussed with the daughter and she currently wants the patient full code but apparently the patient is expressed no dialysis and no intubation in the past  #) Elevated LFTs: Patient noted to have mixed hepatocellular and cholestatic pattern of liver injury.  Unclear if this is related to congestive hepatopathy or possibly underlying liver disease.  Additionally medication side effect could be a cause.  Certainly if she has underlying liver dysfunction could be causing hepatic encephalopathy -Right upper quadrant  ultrasound -We will check ammonia  #) Acute on chronic systolic heart failure exacerbation: Continues to diurese fairly well -Heart failure following, appreciate recommendations, continue IV furosemide per cardiology -Strict ins and outs, weigh daily - Continue metoprolol tartrate 25 mg 4 times daily, this was fractionated by advanced heart failure next -Patient cannot tolerate ACE inhibitor due to AKI  #) Active tobacco abuse/COPD: Currently patient is not wheezing. -Continue short-acting bronchodilators  #) History of CVA/left apical thrombus/paroxysmal atrial fibrillation: -Beta-blocker per below -Continue apixaban 2.5 mg twice daily  #) Stage IV CKD: This appears to be relatively stable.  It is not clear if patient is a dialysis candidate however patient apparently has told the daughter that she does not want dialysis. -Hold nephrotoxins -Continue calcitriol 3 times a week -Start oral bicarbonate  #) Hypertension/hyperlipidemia: -Continue aspirin 81 mg -Continue atorvastatin 80 mg daily -Continue beta-blocker  Fluids: Restrict Electrolytes: Monitor and supplement Nutrition: Heart healthy diet   Prophylaxis: On apixaban  Disposition: Pending discussion of goals of care and improvement in respiratory status  Full code   Consultants:   Cardiology  Palliative care  Procedures:  none  Antimicrobials:   none    Subjective: This morning the patient is sleeping but arousable.  She is more alert.  She denies any shortness of breath, chest pain, nausea, vomiting, diarrhea, cough, congestion, rhinorrhea.  Objective: Vitals:   05/10/18 1600 05/10/18 2030 05/10/18 2102 05/11/18 0610  BP: (!) 127/96 (!) 138/91  (!) 135/91  Pulse: 72 74  67  Resp:  16  20  Temp:   (!) 97.5 F (36.4 C) 98.1 F (36.7 C)  TempSrc:  Oral Oral  SpO2:   100% 99%  Weight:      Height:        Intake/Output Summary (Last 24 hours) at 05/11/2018 0850 Last data filed at 05/11/2018  5537 Gross per 24 hour  Intake 600 ml  Output 1150 ml  Net -550 ml   Filed Weights   05/08/18 0520 05/09/18 0418 05/10/18 0630  Weight: 55.2 kg 56.5 kg 57.2 kg    Examination:  General exam: Appears calm and comfortable  Respiratory system: No increased work of breathing, diminished lung sounds at bases, no crackles Cardiovascular system: Regular rate and rhythm, no murmurs Gastrointestinal system: Soft, nondistended, no rebound or guarding, plus bowel sounds Central nervous system: Alert but oriented only to self.  Responds to questions and moves all extremities.  No focal weakness Extremities: No lower extremity edema Skin: No rashes over visible skin Psychiatry: Unable to assess due to medical condition    Data Reviewed: I have personally reviewed following labs and imaging studies  CBC: Recent Labs  Lab 05/07/18 0857 05/08/18 0430 05/11/18 0651  WBC 8.1 9.5 13.1*  HGB 12.0 12.7 11.8*  HCT 39.2 39.7 36.3  MCV 95.6 91.9 89.9  PLT 91* 100* 95*   Basic Metabolic Panel: Recent Labs  Lab 05/07/18 0857 05/08/18 0430 05/09/18 0712 05/10/18 0405  NA 139 138 135 138  K 5.5* 5.0 5.0 4.8  CL 114* 112* 107 108  CO2 10* 12* 18* 17*  GLUCOSE 85 88 134* 124*  BUN 47* 50* 59* 62*  CREATININE 2.63* 2.72* 2.98* 3.00*  CALCIUM 9.2 8.5* 8.4* 8.5*   GFR: Estimated Creatinine Clearance: 11.1 mL/min (A) (by C-G formula based on SCr of 3 mg/dL (H)). Liver Function Tests: No results for input(s): AST, ALT, ALKPHOS, BILITOT, PROT, ALBUMIN in the last 168 hours. No results for input(s): LIPASE, AMYLASE in the last 168 hours. No results for input(s): AMMONIA in the last 168 hours. Coagulation Profile: No results for input(s): INR, PROTIME in the last 168 hours. Cardiac Enzymes: Recent Labs  Lab 05/07/18 1815 05/07/18 2328 05/08/18 0430  TROPONINI 0.10* 0.11* 0.10*   BNP (last 3 results) No results for input(s): PROBNP in the last 8760 hours. HbA1C: No results for  input(s): HGBA1C in the last 72 hours. CBG: Recent Labs  Lab 05/08/18 2135  GLUCAP 167*   Lipid Profile: No results for input(s): CHOL, HDL, LDLCALC, TRIG, CHOLHDL, LDLDIRECT in the last 72 hours. Thyroid Function Tests: No results for input(s): TSH, T4TOTAL, FREET4, T3FREE, THYROIDAB in the last 72 hours. Anemia Panel: No results for input(s): VITAMINB12, FOLATE, FERRITIN, TIBC, IRON, RETICCTPCT in the last 72 hours. Sepsis Labs: No results for input(s): PROCALCITON, LATICACIDVEN in the last 168 hours.  No results found for this or any previous visit (from the past 240 hour(s)).       Radiology Studies: No results found.      Scheduled Meds: . apixaban  2.5 mg Oral BID  . aspirin EC  81 mg Oral Daily  . atorvastatin  80 mg Oral QPM  . calcitRIOL  0.5 mcg Oral Once per day on Mon Tue Wed Thu Fri  . feeding supplement (ENSURE ENLIVE)  237 mL Oral BID BM  . furosemide  80 mg Intravenous TID  . mouth rinse  15 mL Mouth Rinse BID  . metoprolol tartrate  25 mg Oral QID  . multivitamin with minerals  1 tablet Oral Daily  . nicotine  14 mg Transdermal Daily  . pantoprazole  40 mg Oral Daily   Continuous Infusions:   LOS: 2 days    Time spent: Sharon, MD [facility]  If 7PM-7AM, please contact night-coverage [email or web address] Password [facility] 05/11/2018, 8:50 AM

## 2018-05-11 NOTE — Progress Notes (Signed)
Progress Note  Patient Name: Maureen Mendez Date of Encounter: 05/11/2018  Primary Cardiologist: No primary care provider on file. [facility].   Subjective   In review of medical records from consult note, last visit in July 2019 was in sinus rhythm at the time with a EF 25% in 2017 with apical infarct thromboembolic CVA.  Declined defibrillator in the past.  Apical thrombus in the past.  Has a history of paroxysmal atrial fibrillation and duodenal bleed on Coumadin.  Amiodarone was stopped because of liver toxicity.  She had a history of noncompliance with medical therapy.  At some point she was put on Eliquis dosed appropriately for age and renal function.  She came into the hospital with complaints of worsening shortness of breath over the past few days.  Creatinine 2.6-2.9, baseline, mildly elevated flat troponin demand ischemia.  She was in atrial flutter with variable AV conduction, heart rate 121 bpm.  Currently sleeping comfortably in bed.  Does somewhat arouse to touch.  No complaints.  Niece in room.  Inpatient Medications    Scheduled Meds: . apixaban  2.5 mg Oral BID  . aspirin EC  81 mg Oral Daily  . atorvastatin  80 mg Oral QPM  . calcitRIOL  0.5 mcg Oral Once per day on Mon Tue Wed Thu Fri  . feeding supplement (ENSURE ENLIVE)  237 mL Oral BID BM  . furosemide  80 mg Intravenous TID  . mouth rinse  15 mL Mouth Rinse BID  . metoprolol tartrate  25 mg Oral QID  . multivitamin with minerals  1 tablet Oral Daily  . nicotine  14 mg Transdermal Daily  . pantoprazole  40 mg Oral Daily  . sodium bicarbonate  650 mg Oral TID   Continuous Infusions:  PRN Meds: acetaminophen, diclofenac sodium, docusate sodium, guaiFENesin-dextromethorphan, hydrOXYzine, ipratropium, levalbuterol, polyethylene glycol, polyvinyl alcohol   Vital Signs    Vitals:   05/10/18 2030 05/10/18 2102 05/11/18 0610 05/11/18 1003  BP: (!) 138/91  (!) 135/91 (!) 127/101  Pulse: 74  67 78  Resp: 16   20   Temp:  (!) 97.5 F (36.4 C) 98.1 F (36.7 C)   TempSrc:  Oral Oral   SpO2:  100% 99%   Weight:      Height:        Intake/Output Summary (Last 24 hours) at 05/11/2018 1110 Last data filed at 05/11/2018 0612 Gross per 24 hour  Intake 600 ml  Output 1150 ml  Net -550 ml   Last 3 Weights 05/10/2018 05/09/2018 05/08/2018  Weight (lbs) 126 lb 124 lb 9 oz 121 lb 11.1 oz  Weight (kg) 57.153 kg 56.5 kg 55.2 kg      Telemetry    Atrial flutter heart rate in the 70s, no pauses- Personally Reviewed  ECG    No new- Personally Reviewed  Physical Exam   GEN: Elderly, frail-appearing, sleepy in bed  HEENT: normal  Neck: no JVD, carotid bruits, or masses Cardiac: Slightly irregular; no murmurs, rubs, or gallops,no edema  Respiratory:  clear to auscultation bilaterally, normal work of breathing GI: soft, nontender, nondistended, + BS MS: no deformity or atrophy  Skin: warm and dry, no rash Neuro: Unable to assess Psych: Assess   Labs    Chemistry Recent Labs  Lab 05/09/18 0712 05/10/18 0405 05/11/18 0651  NA 135 138 137  K 5.0 4.8 4.7  CL 107 108 106  CO2 18* 17* 17*  GLUCOSE 134* 124* 111*  BUN  59* 62* 69*  CREATININE 2.98* 3.00* 2.96*  CALCIUM 8.4* 8.5* 8.4*  PROT  --   --  5.8*  ALBUMIN  --   --  2.6*  AST  --   --  183*  ALT  --   --  243*  ALKPHOS  --   --  164*  BILITOT  --   --  2.0*  GFRNONAA 14* 14* 14*  GFRAA 16* 16* 16*  ANIONGAP 10 13 14      Hematology Recent Labs  Lab 05/07/18 0857 05/08/18 0430 05/11/18 0651  WBC 8.1 9.5 13.1*  RBC 4.10 4.32 4.04  HGB 12.0 12.7 11.8*  HCT 39.2 39.7 36.3  MCV 95.6 91.9 89.9  MCH 29.3 29.4 29.2  MCHC 30.6 32.0 32.5  RDW 23.3* 22.9* 22.2*  PLT 91* 100* 95*    Cardiac Enzymes Recent Labs  Lab 05/07/18 1815 05/07/18 2328 05/08/18 0430  TROPONINI 0.10* 0.11* 0.10*    Recent Labs  Lab 05/07/18 0902  TROPIPOC 0.12*     BNP Recent Labs  Lab 05/07/18 0857  BNP 3,324.2*     DDimer No  results for input(s): DDIMER in the last 168 hours.   Radiology    No results found.  Cardiac Studies   Echocardiogram 04/08/2018: EF 35 to 40% with secondary pulmonary hypertension 45 mmHg  Patient Profile     85 y.o. female here with acute on chronic systolic heart failure, paroxysmal atrial fibrillation flutter, chronic kidney disease stage IV, hypertension, history of apical thrombus on Eliquis, ventricular tachycardia prior refusal of ICD.  EF 25% previously.  Assessment & Plan    Acute on chronic systolic heart failure - In January EF was 35 to 40% with moderate RV dysfunction, originally 25% EF. -On 05/09/2018, yesterday Zaroxolyn was given in addition to her Lasix because weight had increased and she was wheezing on exam.  2.5, low-dose of Zaroxolyn. - She now appears euvolemic, her BUN is increasing, creatinine is remaining stable around 3, I will discontinue her IV Lasix and give her p.o 20 mg daily.  Continue to monitor her fluid status.  She may very well require much higher dose of Lasix.  Demand ischemia -Low-level troponin flat secondary to underlying heart failure episode.  This does worsen overall prognosis.  No changes   Paroxysmal atrial fibrillation/flutter -Currently rate controlled on Eliquis.  Severe atrial dilatation.  Did not tolerate amiodarone in the past due to liver toxicity.  Low-dose Eliquis being utilized. -Atrial flutter has been very stable.  I will discontinue her telemetry  Chronic kidney disease stage IV - Creatinine at baseline approximately 2.9.  No significant change however BUN is increasing.  Essential hypertension -Reasonably controlled.  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 Agree with palliative care consult especially for goals of care.      For questions or updates, please contact [place] Please consult [email or web address] for contact info under        Signed, Candee Furbish, MD  05/11/2018, 11:10 AM

## 2018-05-12 DIAGNOSIS — I2510 Atherosclerotic heart disease of native coronary artery without angina pectoris: Secondary | ICD-10-CM

## 2018-05-12 DIAGNOSIS — I5043 Acute on chronic combined systolic (congestive) and diastolic (congestive) heart failure: Secondary | ICD-10-CM

## 2018-05-12 DIAGNOSIS — I2584 Coronary atherosclerosis due to calcified coronary lesion: Secondary | ICD-10-CM

## 2018-05-12 DIAGNOSIS — Z515 Encounter for palliative care: Secondary | ICD-10-CM

## 2018-05-12 DIAGNOSIS — J9601 Acute respiratory failure with hypoxia: Secondary | ICD-10-CM

## 2018-05-12 DIAGNOSIS — R945 Abnormal results of liver function studies: Secondary | ICD-10-CM

## 2018-05-12 DIAGNOSIS — R7989 Other specified abnormal findings of blood chemistry: Secondary | ICD-10-CM

## 2018-05-12 LAB — HEPATIC FUNCTION PANEL
ALT: 255 U/L — ABNORMAL HIGH (ref 0–44)
AST: 193 U/L — ABNORMAL HIGH (ref 15–41)
Albumin: 2.4 g/dL — ABNORMAL LOW (ref 3.5–5.0)
Alkaline Phosphatase: 150 U/L — ABNORMAL HIGH (ref 38–126)
Bilirubin, Direct: 0.9 mg/dL — ABNORMAL HIGH (ref 0.0–0.2)
Indirect Bilirubin: 0.7 mg/dL (ref 0.3–0.9)
Total Bilirubin: 1.6 mg/dL — ABNORMAL HIGH (ref 0.3–1.2)
Total Protein: 5.5 g/dL — ABNORMAL LOW (ref 6.5–8.1)

## 2018-05-12 LAB — CBC
HCT: 35 % — ABNORMAL LOW (ref 36.0–46.0)
Hemoglobin: 11.7 g/dL — ABNORMAL LOW (ref 12.0–15.0)
MCH: 29.4 pg (ref 26.0–34.0)
MCHC: 33.4 g/dL (ref 30.0–36.0)
MCV: 87.9 fL (ref 80.0–100.0)
Platelets: 91 10*3/uL — ABNORMAL LOW (ref 150–400)
RBC: 3.98 MIL/uL (ref 3.87–5.11)
RDW: 22 % — ABNORMAL HIGH (ref 11.5–15.5)
WBC: 10.5 10*3/uL (ref 4.0–10.5)
nRBC: 3.7 % — ABNORMAL HIGH (ref 0.0–0.2)

## 2018-05-12 LAB — BASIC METABOLIC PANEL
Anion gap: 12 (ref 5–15)
CO2: 21 mmol/L — ABNORMAL LOW (ref 22–32)
Calcium: 8.4 mg/dL — ABNORMAL LOW (ref 8.9–10.3)
Chloride: 105 mmol/L (ref 98–111)
Creatinine, Ser: 2.94 mg/dL — ABNORMAL HIGH (ref 0.44–1.00)
GFR calc Af Amer: 16 mL/min — ABNORMAL LOW (ref >60)
GFR calc non Af Amer: 14 mL/min — ABNORMAL LOW (ref >60)
Glucose, Bld: 108 mg/dL — ABNORMAL HIGH (ref 70–99)
Potassium: 3.9 mmol/L (ref 3.5–5.1)

## 2018-05-12 LAB — BASIC METABOLIC PANEL WITH GFR
BUN: 68 mg/dL — ABNORMAL HIGH (ref 8–23)
Sodium: 138 mmol/L (ref 135–145)

## 2018-05-12 LAB — PATHOLOGIST SMEAR REVIEW

## 2018-05-12 NOTE — Progress Notes (Signed)
Family updated by MD 

## 2018-05-12 NOTE — Progress Notes (Addendum)
Progress Note  Patient Name: Maureen Mendez Date of Encounter: 05/12/2018  Primary Cardiologist: [facility]  Subjective   Pt is lethargic and sleepy. Niece at bedside and stated that pt was coughing up blood last night (sounds like 1-2 occassions). Pt denies pain and states she is breathing well - on [place]  Inpatient Medications    Scheduled Meds: . apixaban  2.5 mg Oral BID  . aspirin EC  81 mg Oral Daily  . atorvastatin  80 mg Oral QPM  . calcitRIOL  0.5 mcg Oral Once per day on Mon Tue Wed Thu Fri  . feeding supplement (ENSURE ENLIVE)  237 mL Oral BID BM  . furosemide  20 mg Oral Daily  . mouth rinse  15 mL Mouth Rinse BID  . metoprolol tartrate  25 mg Oral QID  . multivitamin with minerals  1 tablet Oral Daily  . nicotine  14 mg Transdermal Daily  . pantoprazole  40 mg Oral Daily  . sodium bicarbonate  650 mg Oral TID   Continuous Infusions:  PRN Meds: acetaminophen, diclofenac sodium, docusate sodium, guaiFENesin-dextromethorphan, hydrOXYzine, ipratropium, levalbuterol, polyethylene glycol, polyvinyl alcohol   Vital Signs    Vitals:   05/11/18 1444 05/11/18 1741 05/11/18 2024 05/12/18 0658  BP: (!) 125/101 (!) 120/95 (!) 119/97 (!) 130/116  Pulse: 78 85 65 92  Resp: 20  18 20   Temp: (!) 97.5 F (36.4 C)  98 F (36.7 C) 97.6 F (36.4 C)  TempSrc: Oral  Oral Oral  SpO2:   94% 97%  Weight:    44.2 kg  Height:        Intake/Output Summary (Last 24 hours) at 05/12/2018 0804 Last data filed at 05/12/2018 0200 Gross per 24 hour  Intake 420 ml  Output 1300 ml  Net -880 ml   Last 3 Weights 05/12/2018 05/10/2018 05/09/2018  Weight (lbs) 97 lb 8 oz 126 lb 124 lb 9 oz  Weight (kg) 44.226 kg 57.153 kg 56.5 kg      Telemetry    atrial flutter, rates improved to 80s  - Personally Reviewed  ECG    No new tracings - Personally Reviewed  Physical Exam   GEN: elderly appearing female, sleepy Neck: minimal JVD Cardiac: irregular rhythm, regular rate, heart sounds difficult  over breath sounds Respiratory: coarse sounds throughout GI: Soft, nontender, non-distended  MS: No edema; No deformity. Neuro:  Nonfocal  Psych: Normal affect   Labs    Chemistry Recent Labs  Lab 05/10/18 0405 05/11/18 0651 05/12/18 0455  NA 138 137 138  K 4.8 4.7 3.9  CL 108 106 105  CO2 17* 17* 21*  GLUCOSE 124* 111* 108*  BUN 62* 69* 68*  CREATININE 3.00* 2.96* 2.94*  CALCIUM 8.5* 8.4* 8.4*  PROT  --  5.8* 5.5*  ALBUMIN  --  2.6* 2.4*  AST  --  183* 193*  ALT  --  243* 255*  ALKPHOS  --  164* 150*  BILITOT  --  2.0* 1.6*  GFRNONAA 14* 14* 14*  GFRAA 16* 16* 16*  ANIONGAP 13 14 12      Hematology Recent Labs  Lab 05/08/18 0430 05/11/18 0651 05/12/18 0455  WBC 9.5 13.1* 10.5  RBC 4.32 4.04 3.98  HGB 12.7 11.8* 11.7*  HCT 39.7 36.3 35.0*  MCV 91.9 89.9 87.9  MCH 29.4 29.2 29.4  MCHC 32.0 32.5 33.4  RDW 22.9* 22.2* 22.0*  PLT 100* 95* 91*    Cardiac Enzymes Recent Labs  Lab 05/07/18  1815 05/07/18 2328 05/08/18 0430  TROPONINI 0.10* 0.11* 0.10*    Recent Labs  Lab 05/07/18 0902  TROPIPOC 0.12*     BNP Recent Labs  Lab 05/07/18 0857  BNP 3,324.2*     DDimer No results for input(s): DDIMER in the last 168 hours.   Radiology    Ct Head Wo Contrast  Result Date: 05/11/2018 CLINICAL DATA:  Loss of consciousness.  History of stroke. EXAM: CT HEAD WITHOUT CONTRAST TECHNIQUE: Contiguous axial images were obtained from the base of the skull through the vertex without intravenous contrast. COMPARISON:  None. FINDINGS: BRAIN: No intraparenchymal hemorrhage, mass effect nor midline shift. Moderate to severe parenchymal brain volume loss for age. No hydrocephalus. Confluent supratentorial white matter hypodensities. Old RIGHT thalamus infarct. Heterogeneous basal ganglia seen with chronic small vessel ischemic changes. Old RIGHT cerebellar infarcts. No acute large vascular territory infarcts. No abnormal extra-axial fluid collections. Basal cisterns are  patent. VASCULAR: Moderate to severe calcific atherosclerosis of the carotid siphons. SKULL: No skull fracture. No significant scalp soft tissue swelling. SINUSES/ORBITS: Trace paranasal sinus mucosal thickening. Mastoid air cells are well aerated.The included ocular globes and orbital contents are non-suspicious. Status post RIGHT ocular lens implant. OTHER: None. IMPRESSION: 1. No acute intracranial process. 2. Severe chronic small vessel ischemic changes. Old RIGHT thalamus and RIGHT cerebellar infarcts. 3. Moderate to severe parenchymal brain volume loss. Electronically Signed   By: Elon Alas M.D.   On: 05/11/2018 16:03   US Liver Doppler  Result Date: 05/11/2018 CLINICAL DATA:  Elevated LFTs EXAM: DUPLEX ULTRASOUND OF LIVER TECHNIQUE: Color and duplex Doppler ultrasound was performed to evaluate the hepatic in-flow and out-flow vessels. COMPARISON:  None. FINDINGS: Liver: Heterogeneous echotexture. Normal hepatic contour without nodularity. No focal lesion, mass or intrahepatic biliary ductal dilatation. Main Portal Vein size: 0.7 cm Portal Vein Velocities Main Prox:  26.7 cm/sec Main Mid: 27.3 cm/sec Main Dist:  33.7 cm/sec Right: 24.2 cm/sec Left: 22.6 cm/sec Hepatic Vein Velocities Right:  Is 28.1 cm/sec Middle:  28.4 cm/sec Left:  29.5 cm/sec IVC: Present and patent with normal respiratory phasicity. Hepatic Artery Velocity:  55.6 cm/sec Splenic Vein Velocity:  36.7 cm/sec Spleen: 2.7 cm x 4.2 cm x 2.0 cm with a total volume of 11.8 cm^3 (411 cm^3 is upper limit normal) Portal Vein Occlusion/Thrombus: No Splenic Vein Occlusion/Thrombus: No Ascites: None Varices: None Small cyst seen in the kidneys bilaterally. Bilateral pleural effusions noted. IMPRESSION: No evidence of portal vein thrombosis or hypertension. Electronically Signed   By: Rolm Baptise M.D.   On: 05/11/2018 17:36   US Abdomen Limited Ruq  Result Date: 05/11/2018 CLINICAL DATA:  Elevated LFTs. EXAM: ULTRASOUND ABDOMEN LIMITED  RIGHT UPPER QUADRANT COMPARISON:  None. FINDINGS: Gallbladder: No gallstones or gallbladder wall thickening. No pericholecystic fluid. The sonographer reports no sonographic Murphy's sign. Common bile duct: Diameter: 2 mm Liver: No focal lesion identified. Within normal limits in parenchymal echogenicity. Portal vein is patent on color Doppler imaging with normal direction of blood flow towards the liver. Incidental: Right pleural effusion evident. Small cyst identified adjacent to the right hepatic lobe, indeterminate. IMPRESSION: No intra or extrahepatic biliary duct dilatation. Electronically Signed   By: Misty Stanley M.D.   On: 05/11/2018 17:16    Cardiac Studies   Echo 04/08/18: Study Conclusions - Left ventricle: The cavity size was normal. There was severe   concentric hypertrophy. Systolic function was moderately reduced.   The estimated ejection fraction was in the range of 35% to 40%.  Diffuse hypokinesis. Doppler parameters are consistent with   pseudonormal left ventricular relaxation (grade 2 diastolic   dysfunction). The E/e&' ratio is >15, suggesting elevated LV   filling pressure. - Mitral valve: Mildly thickened leaflets . There was mild   regurgitation. - Left atrium: Severely dilated. - Right ventricle: The cavity size was mildly dilated. Moderately   reduced systolic function. - Right atrium: Severely dilated. - Tricuspid valve: There was mild regurgitation. - Pulmonary arteries: PA peak pressure: 45 mm Hg (S). - Inferior vena cava: The vessel was dilated. The respirophasic   diameter changes were blunted (< 50%), consistent with elevated   central venous pressure.  Impressions: - LVEF 35-40%, severe LVH, global hypokinesis, grade 2 DD, elevated   LV filling pressure, mild MR, severe LAE, mildly dilated RV with   moderate systolic dysfunction, severe RAE, mild TR, RVSP 45 mmHg,   dilated IVC.  Patient Profile     85 y.o. female here with acute on chronic  systolic heart failure, paroxysmal atrial fibrillation flutter, chronic kidney disease stage IV, hypertension, history of apical thrombus on Eliquis, ventricular tachycardia prior refusal of ICD.  EF 25% previously  Assessment & Plan    1. Acute on chronic systolic heart failure - EF on last echo 03/2018 was 35-40% with moderate RV dysfunction, improved from previous EF of 25% - 2.5 mg zaroxolyn given on 2/21 for wheezing - she was transitioned to PO lasix 20 mg yesterday - she is overall net negative  1.8 L with 1.3 L urine output - weight is down to 95 lbs - she appears euvolemic today, pt states her breathing is OK  2. CKD stage IV - sCr baseline 2.6-2.9 - sCr today 2.94  3. Hx of CVA and apical thrombus 4. Atrial flutter with variable AV conduction - rates in the 120s - on low dose eliquis - avoid amiodarone for liver toxicity - pt tolerating rate and rhythm - pt niece reports 1-2 episodes of hemoptysis overnight - Hb stable at 11.7 (11.8)  5. Elevated troponin - thought to be demand ischemia in the setting of heart failure - no further workup  6. Hypertension - pressures well-controlled - no medication changes  Palliative care following.    For questions or updates, please contact [place] Please consult [email or web address] for contact info under     Signed, Ledora Bottcher, PA  05/12/2018, 8:04 AM    The patient was seen, examined and discussed with Minette Brine , PA-C and I agree with the above.   85 year old female with underlying dementia, was admitted with acute on chronic systolic heart failure CKD stage IV, history of CVA, with apical thrombus on low-dose Eliquis, persistent atrial flutter, elevated troponin, the patient is slowly improving with regards to heart failure, she was switched to oral diuretics yesterday, she has stable weights, with negative fluid balance overnight, inaccurate weights, we will continue p.o. Lasix.  Her daughter reported hemoptysis  overnight, she has stable hemoglobin of 11.7 from 11.8 yesterday. Overall prognosis very poor given that she has multiorgan failure and advanced dementia.  Her daughter who is a nurse wants to continue full code.  Palliative care follows along, will continue full support until decided otherwise.  Ena Dawley, MD 05/12/2018

## 2018-05-12 NOTE — Progress Notes (Signed)
PROGRESS NOTE    Maureen Mendez  GYK:[id number] DOB: 1929/08/26 DOA: 05/07/2018 PCP: Patient, No Pcp Per   Brief Narrative:  85 year old with past medical history relevant for stage IV chronic kidney disease, history of CVA and apical thrombus as well as and paroxysmal atrial fibrillation on apixaban, chronic systolic heart failure EF of 35 to 40% with grade 2 diastolic dysfunction by echo on 04/08/2018, active smoker, who presented on 05/07/2018 with shortness of breath that was felt to be multifactorial.  Her hospital course has been complicated by acute metabolic encephalopathy.   Assessment & Plan:   Principal Problem:   Acute respiratory failure with hypoxia (HCC) Active Problems:   CAD (coronary artery disease)   CVA (cerebral vascular accident) ([place])   Elevated troponin   CKD (chronic kidney disease) stage 4, GFR 15-29 ml/min (HCC)   Chronic systolic CHF (congestive heart failure) (HCC)   Smoker   Paroxysmal A-fib (HCC)   Acute on chronic congestive heart failure (HCC)   Protein-calorie malnutrition, severe   Goals of care, counseling/discussion   Palliative care by specialist     #) Acute metabolic encephalopathy: Improving - We will avoid sedating medications -Ammonia normal -Pending placement -Palliative care consulted, at this time the daughter wants everything done -Head CT unremarkable except for severe small vessel disease  #) Elevated LFTs: Imaging shows no evidence of structural problem.  LFTs continue to be elevated but fairly mild. -Right upper quadrant ultrasound shows no evidence of structural contributions or cirrhosis -Ammonia normal  #) Acute on chronic systolic heart failure exacerbation: Continues to diurese fairly well -Heart failure following, appreciate recommendations, continue IV furosemide per cardiology -Strict ins and outs, weigh daily - Continue metoprolol tartrate 25 mg 4 times daily, this was fractionated by advanced heart failure  -Patient  cannot tolerate ACE inhibitor due to AKI  #) Shortness of breath: Resolved.  #) Active tobacco abuse/COPD: Currently patient is not wheezing. -Continue short-acting bronchodilators  #) History of CVA/left apical thrombus/paroxysmal atrial fibrillation: -Beta-blocker per below -Continue apixaban 2.5 mg twice daily  #) Stage IV CKD: This appears to be relatively stable.  It is not clear if patient is a dialysis candidate however patient apparently has told the daughter that she does not want dialysis. -Hold nephrotoxins -Continue calcitriol 3 times a week -Continue oral bicarbonate  #) Hypertension/hyperlipidemia: -Continue aspirin 81 mg -Continue atorvastatin 80 mg daily -Continue beta-blocker  Fluids: Restrict Electrolytes: Monitor and supplement Nutrition: Heart healthy diet   Prophylaxis: On apixaban  Disposition: Pending discharge to skilled nursing facility  Full code   Consultants:   Cardiology  Palliative care  Procedures:  none  Antimicrobials:   none    Subjective: This morning patient is sitting up in bed and smiling.  She is interactive.  She is much more alert and somewhat more oriented.  She denies any shortness of breath, nausea, vomiting, diarrhea, cough, congestion, rhinorrhea.  Objective: Vitals:   05/11/18 1444 05/11/18 1741 05/11/18 2024 05/12/18 0658  BP: (!) 125/101 (!) 120/95 (!) 119/97 (!) 130/116  Pulse: 78 85 65 92  Resp: 20  18 20   Temp: (!) 97.5 F (36.4 C)  98 F (36.7 C) 97.6 F (36.4 C)  TempSrc: Oral  Oral Oral  SpO2:   94% 97%  Weight:    44.2 kg  Height:        Intake/Output Summary (Last 24 hours) at 05/12/2018 0836 Last data filed at 05/12/2018 0834 Gross per 24 hour  Intake 520 ml  Output 1300 ml  Net -780 ml   Filed Weights   05/09/18 0418 05/10/18 0630 05/12/18 0658  Weight: 56.5 kg 57.2 kg 44.2 kg    Examination:  General exam: Appears calm and comfortable  Respiratory system: No increased work of  breathing, diminished lung sounds at bases, no crackles Cardiovascular system: Regular rate and rhythm, no murmurs Gastrointestinal system: Soft, nondistended, no rebound or guarding, plus bowel sounds Central nervous system: Alert but oriented only to self.  Responds to questions and moves all extremities.  No focal weakness Extremities: No lower extremity edema Skin: No rashes over visible skin Psychiatry: Unable to assess due to medical condition    Data Reviewed: I have personally reviewed following labs and imaging studies  CBC: Recent Labs  Lab 05/07/18 0857 05/08/18 0430 05/11/18 0651 05/12/18 0455  WBC 8.1 9.5 13.1* 10.5  HGB 12.0 12.7 11.8* 11.7*  HCT 39.2 39.7 36.3 35.0*  MCV 95.6 91.9 89.9 87.9  PLT 91* 100* 95* 91*   Basic Metabolic Panel: Recent Labs  Lab 05/08/18 0430 05/09/18 0712 05/10/18 0405 05/11/18 0651 05/12/18 0455  NA 138 135 138 137 138  K 5.0 5.0 4.8 4.7 3.9  CL 112* 107 108 106 105  CO2 12* 18* 17* 17* 21*  GLUCOSE 88 134* 124* 111* 108*  BUN 50* 59* 62* 69* 68*  CREATININE 2.72* 2.98* 3.00* 2.96* 2.94*  CALCIUM 8.5* 8.4* 8.5* 8.4* 8.4*  MG  --   --   --  2.3  --    GFR: Estimated Creatinine Clearance: 9.6 mL/min (A) (by C-G formula based on SCr of 2.94 mg/dL (H)). Liver Function Tests: Recent Labs  Lab 05/11/18 0651 05/12/18 0455  AST 183* 193*  ALT 243* 255*  ALKPHOS 164* 150*  BILITOT 2.0* 1.6*  PROT 5.8* 5.5*  ALBUMIN 2.6* 2.4*   No results for input(s): LIPASE, AMYLASE in the last 168 hours. Recent Labs  Lab 05/11/18 0940  AMMONIA 29   Coagulation Profile: No results for input(s): INR, PROTIME in the last 168 hours. Cardiac Enzymes: Recent Labs  Lab 05/07/18 1815 05/07/18 2328 05/08/18 0430  TROPONINI 0.10* 0.11* 0.10*   BNP (last 3 results) No results for input(s): PROBNP in the last 8760 hours. HbA1C: No results for input(s): HGBA1C in the last 72 hours. CBG: Recent Labs  Lab 05/08/18 2135  GLUCAP 167*     Lipid Profile: No results for input(s): CHOL, HDL, LDLCALC, TRIG, CHOLHDL, LDLDIRECT in the last 72 hours. Thyroid Function Tests: No results for input(s): TSH, T4TOTAL, FREET4, T3FREE, THYROIDAB in the last 72 hours. Anemia Panel: No results for input(s): VITAMINB12, FOLATE, FERRITIN, TIBC, IRON, RETICCTPCT in the last 72 hours. Sepsis Labs: No results for input(s): PROCALCITON, LATICACIDVEN in the last 168 hours.  No results found for this or any previous visit (from the past 240 hour(s)).       Radiology Studies: Ct Head Wo Contrast  Result Date: 05/11/2018 CLINICAL DATA:  Loss of consciousness.  History of stroke. EXAM: CT HEAD WITHOUT CONTRAST TECHNIQUE: Contiguous axial images were obtained from the base of the skull through the vertex without intravenous contrast. COMPARISON:  None. FINDINGS: BRAIN: No intraparenchymal hemorrhage, mass effect nor midline shift. Moderate to severe parenchymal brain volume loss for age. No hydrocephalus. Confluent supratentorial white matter hypodensities. Old RIGHT thalamus infarct. Heterogeneous basal ganglia seen with chronic small vessel ischemic changes. Old RIGHT cerebellar infarcts. No acute large vascular territory infarcts. No abnormal extra-axial fluid collections. Basal cisterns are patent.  VASCULAR: Moderate to severe calcific atherosclerosis of the carotid siphons. SKULL: No skull fracture. No significant scalp soft tissue swelling. SINUSES/ORBITS: Trace paranasal sinus mucosal thickening. Mastoid air cells are well aerated.The included ocular globes and orbital contents are non-suspicious. Status post RIGHT ocular lens implant. OTHER: None. IMPRESSION: 1. No acute intracranial process. 2. Severe chronic small vessel ischemic changes. Old RIGHT thalamus and RIGHT cerebellar infarcts. 3. Moderate to severe parenchymal brain volume loss. Electronically Signed   By: Elon Alas M.D.   On: 05/11/2018 16:03   US Liver Doppler  Result  Date: 05/11/2018 CLINICAL DATA:  Elevated LFTs EXAM: DUPLEX ULTRASOUND OF LIVER TECHNIQUE: Color and duplex Doppler ultrasound was performed to evaluate the hepatic in-flow and out-flow vessels. COMPARISON:  None. FINDINGS: Liver: Heterogeneous echotexture. Normal hepatic contour without nodularity. No focal lesion, mass or intrahepatic biliary ductal dilatation. Main Portal Vein size: 0.7 cm Portal Vein Velocities Main Prox:  26.7 cm/sec Main Mid: 27.3 cm/sec Main Dist:  33.7 cm/sec Right: 24.2 cm/sec Left: 22.6 cm/sec Hepatic Vein Velocities Right:  Is 28.1 cm/sec Middle:  28.4 cm/sec Left:  29.5 cm/sec IVC: Present and patent with normal respiratory phasicity. Hepatic Artery Velocity:  55.6 cm/sec Splenic Vein Velocity:  36.7 cm/sec Spleen: 2.7 cm x 4.2 cm x 2.0 cm with a total volume of 11.8 cm^3 (411 cm^3 is upper limit normal) Portal Vein Occlusion/Thrombus: No Splenic Vein Occlusion/Thrombus: No Ascites: None Varices: None Small cyst seen in the kidneys bilaterally. Bilateral pleural effusions noted. IMPRESSION: No evidence of portal vein thrombosis or hypertension. Electronically Signed   By: Rolm Baptise M.D.   On: 05/11/2018 17:36   US Abdomen Limited Ruq  Result Date: 05/11/2018 CLINICAL DATA:  Elevated LFTs. EXAM: ULTRASOUND ABDOMEN LIMITED RIGHT UPPER QUADRANT COMPARISON:  None. FINDINGS: Gallbladder: No gallstones or gallbladder wall thickening. No pericholecystic fluid. The sonographer reports no sonographic Murphy's sign. Common bile duct: Diameter: 2 mm Liver: No focal lesion identified. Within normal limits in parenchymal echogenicity. Portal vein is patent on color Doppler imaging with normal direction of blood flow towards the liver. Incidental: Right pleural effusion evident. Small cyst identified adjacent to the right hepatic lobe, indeterminate. IMPRESSION: No intra or extrahepatic biliary duct dilatation. Electronically Signed   By: Misty Stanley M.D.   On: 05/11/2018 17:16         Scheduled Meds: . apixaban  2.5 mg Oral BID  . aspirin EC  81 mg Oral Daily  . atorvastatin  80 mg Oral QPM  . calcitRIOL  0.5 mcg Oral Once per day on Mon Tue Wed Thu Fri  . feeding supplement (ENSURE ENLIVE)  237 mL Oral BID BM  . furosemide  20 mg Oral Daily  . mouth rinse  15 mL Mouth Rinse BID  . metoprolol tartrate  25 mg Oral QID  . multivitamin with minerals  1 tablet Oral Daily  . nicotine  14 mg Transdermal Daily  . pantoprazole  40 mg Oral Daily  . sodium bicarbonate  650 mg Oral TID   Continuous Infusions:   LOS: 3 days    Time spent: [place], MD [facility]  If 7PM-7AM, please contact night-coverage [email or web address] Password [facility] 05/12/2018, 8:36 AM

## 2018-05-13 ENCOUNTER — Inpatient Hospital Stay (HOSPITAL_COMMUNITY): Payer: Medicare Other

## 2018-05-13 DIAGNOSIS — Z515 Encounter for palliative care: Secondary | ICD-10-CM

## 2018-05-13 DIAGNOSIS — N184 Chronic kidney disease, stage 4 (severe): Secondary | ICD-10-CM

## 2018-05-13 NOTE — Care Management Important Message (Signed)
Important Message  Patient Details  Name: Maureen Mendez MRN: [id number] Date of Birth: Aug 12, 1929   Medicare Important Message Given:  Other (see comment)  Pt . Unable to sign due to medical condition.    Holli Humbles Smith 05/13/2018, 2:39 PM

## 2018-05-13 NOTE — Progress Notes (Signed)
Occupational Therapy Treatment Patient Details Name: Maureen Mendez MRN: [id number] DOB: [birth year] Today's Date: 05/13/2018    History of present illness Pt is an 85 y.o. female admitted 05/07/18 with SOB; worked up for CHF exacerbation. PMH includes CKD IV, stroke, CHF, COPD, asthma, afib. Of note, recent admission for COPD.    OT comments  Pt not progressing toward OT goals. Per niece report and notes from previous staff, family will take pt home instead of SNF. Pt participated in bed level mobility to change pads and place purewick but declined all further skilled intervention. Began caregiver edu re pressure sore prevention and turn schedules. Frequency updated to 1x/ week to reflect pt resistance to work with therapy and lack of progress overall.    Follow Up Recommendations  SNF;Supervision/Assistance - 24 hour    Equipment Recommendations  (if taking pt home- [facility])       Precautions / Restrictions Precautions Precautions: Fall Precaution Comments: On 2L O2        Mobility Bed Mobility Overal bed mobility: Needs Assistance Bed Mobility: Rolling Rolling: Mod assist         General bed mobility comments: mod A to roll R and L, with improved participation during peri hygiene task at bed level  Transfers                          ADL either performed or assessed with clinical judgement   ADL Overall ADL's : Needs assistance/impaired             Lower Body Bathing: Total assistance;Bed level   Upper Body Dressing : Total assistance;Bed level                     General ADL Comments: Pt refused any OOB mobility                Cognition Arousal/Alertness: Lethargic Behavior During Therapy: Flat affect Overall Cognitive Status: History of cognitive impairments - at baseline                                 General Comments: Pt responsive to questions but limited participation              General Comments Pt declined  any OOB mobility or further participation following peri hygiene and changing bed pads    Pertinent Vitals/ Pain       Pain Assessment: No/denies pain         Frequency  Min 1X/week        Progress Toward Goals  OT Goals(current goals can now be found in the care plan section)  Progress towards OT goals: OT to reassess next treatment  Acute Rehab OT Goals Patient Stated Goal: Niece present during session and supportive/helpful, however reports daughter will not allow SNF placement OT Goal Formulation: With family Time For Goal Achievement: 05/23/18 Potential to Achieve Goals: [place] Discharge plan remains appropriate;Frequency needs to be updated       AM-PAC OT "6 Clicks" Daily Activity     Outcome Measure   Help from another person eating meals?: Total Help from another person taking care of personal grooming?: Total Help from another person toileting, which includes using toliet, bedpan, or urinal?: Total Help from another person bathing (including washing, rinsing, drying)?: Total Help from another person to put on and taking off regular upper body clothing?:  Total Help from another person to put on and taking off regular lower body clothing?: Total 6 Click Score: 6    End of Session Equipment Utilized During Treatment: Oxygen  OT Visit Diagnosis: Unsteadiness on feet (R26.81);Repeated falls (R29.6);Muscle weakness (generalized) (M62.81)   Activity Tolerance Patient limited by lethargy   Patient Left in bed;with call bell/phone within reach;with family/visitor present;with nursing/sitter in room   Nurse Communication Mobility status        Time: 4098-1191 OT Time Calculation (min): 13 min  Charges: OT General Charges $OT Visit: 1 Visit OT Treatments $Self Care/Home Management : 8-22 mins   Curtis Sites OTR/L  05/13/2018, 4:30 PM

## 2018-05-13 NOTE — Progress Notes (Signed)
PROGRESS NOTE    Maureen Mendez  YHC:[id number] DOB: 1930-03-03 DOA: 05/07/2018 PCP: Patient, No Pcp Per   Brief Narrative:  85 year old with past medical history relevant for stage IV chronic kidney disease, history of CVA and apical thrombus as well as and paroxysmal atrial fibrillation on apixaban, chronic systolic heart failure EF of 35 to 40% with grade 2 diastolic dysfunction by echo on 04/08/2018, active smoker, who presented on 05/07/2018 with shortness of breath that was felt to be multifactorial.  Her hospital course has been complicated by acute metabolic encephalopathy.   Assessment & Plan:   Principal Problem:   Acute respiratory failure with hypoxia (HCC) Active Problems:   CAD (coronary artery disease)   CVA (cerebral vascular accident) ([place])   Elevated troponin   CKD (chronic kidney disease) stage 4, GFR 15-29 ml/min (HCC)   Chronic systolic CHF (congestive heart failure) (HCC)   Smoker   Paroxysmal A-fib (HCC)   Acute on chronic congestive heart failure (HCC)   Protein-calorie malnutrition, severe   Goals of care, counseling/discussion   Palliative care by specialist   Elevated LFTs  #) Acute metabolic encephalopathy: Resolved - We will avoid sedating medications -Ammonia normal -Pending placement, at this time there is a complicated situation with Adult Protective Services with the daughter going to court -Palliative care consulted, at this time the daughter wants everything done -Head CT unremarkable except for severe small vessel disease  #) Elevated LFTs: Imaging shows no evidence of structural problem.  LFTs continue to be elevated but fairly mild. -Right upper quadrant ultrasound shows no evidence of structural contributions or cirrhosis -Ammonia normal -Trend  #) Acute on chronic systolic heart failure exacerbation: Continues to diurese fairly well -Heart failure following, appreciate recommendations, transition to oral furosemide -Strict ins and outs,  weigh daily - Continue metoprolol tartrate 25 mg 4 times daily, this was fractionated by advanced heart failure  -Patient cannot tolerate ACE inhibitor due to AKI/CKD  #) Active tobacco abuse/COPD: Currently patient is not wheezing. -Continue short-acting bronchodilators  #) History of CVA/left apical thrombus/paroxysmal atrial fibrillation: -Beta-blocker per below -Continue apixaban 2.5 mg twice daily  #) Stage IV CKD: This appears to be relatively stable.  It is not clear if patient is a dialysis candidate however patient apparently has told the daughter that she does not want dialysis. -Hold nephrotoxins -Continue calcitriol 3 times a week -Continue oral bicarbonate  #) Hypertension/hyperlipidemia: -Continue aspirin 81 mg -Continue atorvastatin 80 mg daily -Continue beta-blocker  Fluids: Restrict Electrolytes: Monitor and supplement Nutrition: Heart healthy diet   Prophylaxis: On apixaban  Disposition: Pending outcome of daughter getting power of attorney and involvement of Adult Protective Services  Full code   Consultants:   Cardiology  Palliative care  Procedures:  none  Antimicrobials:   none    Subjective: This morning patient is sitting up in bed and smiling.  She does not have any chest pain, nausea, vomiting, diarrhea, cough, congestion, rhinorrhea.  Objective: Vitals:   05/12/18 2123 05/13/18 0052 05/13/18 0535 05/13/18 0931  BP: (!) 128/92 (!) 123/92 125/86 133/90  Pulse: 94 89 86 86  Resp: 20 16 18    Temp: (!) 97.3 F (36.3 C) (!) 97.3 F (36.3 C) (!) 97.5 F (36.4 C)   TempSrc: Oral Oral Oral   SpO2: (!) 72% 90% 91%   Weight:   45.4 kg   Height:        Intake/Output Summary (Last 24 hours) at 05/13/2018 1132 Last data filed at 05/13/2018 [phone]  Gross per 24 hour  Intake 100 ml  Output 301 ml  Net -201 ml   Filed Weights   05/10/18 0630 05/12/18 0658 05/13/18 0535  Weight: 57.2 kg 44.2 kg 45.4 kg    Examination:  General exam:  Appears calm and comfortable  Respiratory system: No increased work of breathing, diminished lung sounds at bases, no crackles Cardiovascular system: Regular rate and rhythm, no murmurs Gastrointestinal system: Soft, nondistended, no rebound or guarding, plus bowel sounds Central nervous system: Alert but oriented only to self.  Responds to questions and moves all extremities.  No focal weakness Extremities: No lower extremity edema Skin: No rashes over visible skin Psychiatry: Unable to assess due to medical condition    Data Reviewed: I have personally reviewed following labs and imaging studies  CBC: Recent Labs  Lab 05/07/18 0857 05/08/18 0430 05/11/18 0651 05/12/18 0455  WBC 8.1 9.5 13.1* 10.5  HGB 12.0 12.7 11.8* 11.7*  HCT 39.2 39.7 36.3 35.0*  MCV 95.6 91.9 89.9 87.9  PLT 91* 100* 95* 91*   Basic Metabolic Panel: Recent Labs  Lab 05/08/18 0430 05/09/18 0712 05/10/18 0405 05/11/18 0651 05/12/18 0455  NA 138 135 138 137 138  K 5.0 5.0 4.8 4.7 3.9  CL 112* 107 108 106 105  CO2 12* 18* 17* 17* 21*  GLUCOSE 88 134* 124* 111* 108*  BUN 50* 59* 62* 69* 68*  CREATININE 2.72* 2.98* 3.00* 2.96* 2.94*  CALCIUM 8.5* 8.4* 8.5* 8.4* 8.4*  MG  --   --   --  2.3  --    GFR: Estimated Creatinine Clearance: 9.8 mL/min (A) (by C-G formula based on SCr of 2.94 mg/dL (H)). Liver Function Tests: Recent Labs  Lab 05/11/18 0651 05/12/18 0455  AST 183* 193*  ALT 243* 255*  ALKPHOS 164* 150*  BILITOT 2.0* 1.6*  PROT 5.8* 5.5*  ALBUMIN 2.6* 2.4*   No results for input(s): LIPASE, AMYLASE in the last 168 hours. Recent Labs  Lab 05/11/18 0940  AMMONIA 29   Coagulation Profile: No results for input(s): INR, PROTIME in the last 168 hours. Cardiac Enzymes: Recent Labs  Lab 05/07/18 1815 05/07/18 2328 05/08/18 0430  TROPONINI 0.10* 0.11* 0.10*   BNP (last 3 results) No results for input(s): PROBNP in the last 8760 hours. HbA1C: No results for input(s): HGBA1C in  the last 72 hours. CBG: Recent Labs  Lab 05/08/18 2135  GLUCAP 167*   Lipid Profile: No results for input(s): CHOL, HDL, LDLCALC, TRIG, CHOLHDL, LDLDIRECT in the last 72 hours. Thyroid Function Tests: No results for input(s): TSH, T4TOTAL, FREET4, T3FREE, THYROIDAB in the last 72 hours. Anemia Panel: No results for input(s): VITAMINB12, FOLATE, FERRITIN, TIBC, IRON, RETICCTPCT in the last 72 hours. Sepsis Labs: No results for input(s): PROCALCITON, LATICACIDVEN in the last 168 hours.  No results found for this or any previous visit (from the past 240 hour(s)).       Radiology Studies: Ct Head Wo Contrast  Result Date: 05/11/2018 CLINICAL DATA:  Loss of consciousness.  History of stroke. EXAM: CT HEAD WITHOUT CONTRAST TECHNIQUE: Contiguous axial images were obtained from the base of the skull through the vertex without intravenous contrast. COMPARISON:  None. FINDINGS: BRAIN: No intraparenchymal hemorrhage, mass effect nor midline shift. Moderate to severe parenchymal brain volume loss for age. No hydrocephalus. Confluent supratentorial white matter hypodensities. Old RIGHT thalamus infarct. Heterogeneous basal ganglia seen with chronic small vessel ischemic changes. Old RIGHT cerebellar infarcts. No acute large vascular territory infarcts. No  abnormal extra-axial fluid collections. Basal cisterns are patent. VASCULAR: Moderate to severe calcific atherosclerosis of the carotid siphons. SKULL: No skull fracture. No significant scalp soft tissue swelling. SINUSES/ORBITS: Trace paranasal sinus mucosal thickening. Mastoid air cells are well aerated.The included ocular globes and orbital contents are non-suspicious. Status post RIGHT ocular lens implant. OTHER: None. IMPRESSION: 1. No acute intracranial process. 2. Severe chronic small vessel ischemic changes. Old RIGHT thalamus and RIGHT cerebellar infarcts. 3. Moderate to severe parenchymal brain volume loss. Electronically Signed   By: Elon Alas M.D.   On: 05/11/2018 16:03   Dg Chest Port 1 View  Result Date: 05/13/2018 CLINICAL DATA:  Hypoxia EXAM: PORTABLE CHEST 1 VIEW COMPARISON:  05/07/2018 FINDINGS: Cardiac shadow remains enlarged. The lungs are well aerated bilaterally. New density is noted along the lateral chest wall inferiorly on the left consistent with early infiltrate. No sizable effusion is noted. No acute bony abnormality is noted. IMPRESSION: New pleural based density on the left inferiorly likely related to early infiltrate. Electronically Signed   By: Inez Catalina M.D.   On: 05/13/2018 08:18   US Liver Doppler  Result Date: 05/11/2018 CLINICAL DATA:  Elevated LFTs EXAM: DUPLEX ULTRASOUND OF LIVER TECHNIQUE: Color and duplex Doppler ultrasound was performed to evaluate the hepatic in-flow and out-flow vessels. COMPARISON:  None. FINDINGS: Liver: Heterogeneous echotexture. Normal hepatic contour without nodularity. No focal lesion, mass or intrahepatic biliary ductal dilatation. Main Portal Vein size: 0.7 cm Portal Vein Velocities Main Prox:  26.7 cm/sec Main Mid: 27.3 cm/sec Main Dist:  33.7 cm/sec Right: 24.2 cm/sec Left: 22.6 cm/sec Hepatic Vein Velocities Right:  Is 28.1 cm/sec Middle:  28.4 cm/sec Left:  29.5 cm/sec IVC: Present and patent with normal respiratory phasicity. Hepatic Artery Velocity:  55.6 cm/sec Splenic Vein Velocity:  36.7 cm/sec Spleen: 2.7 cm x 4.2 cm x 2.0 cm with a total volume of 11.8 cm^3 (411 cm^3 is upper limit normal) Portal Vein Occlusion/Thrombus: No Splenic Vein Occlusion/Thrombus: No Ascites: None Varices: None Small cyst seen in the kidneys bilaterally. Bilateral pleural effusions noted. IMPRESSION: No evidence of portal vein thrombosis or hypertension. Electronically Signed   By: Rolm Baptise M.D.   On: 05/11/2018 17:36   US Abdomen Limited Ruq  Result Date: 05/11/2018 CLINICAL DATA:  Elevated LFTs. EXAM: ULTRASOUND ABDOMEN LIMITED RIGHT UPPER QUADRANT COMPARISON:  None. FINDINGS:  Gallbladder: No gallstones or gallbladder wall thickening. No pericholecystic fluid. The sonographer reports no sonographic Murphy's sign. Common bile duct: Diameter: 2 mm Liver: No focal lesion identified. Within normal limits in parenchymal echogenicity. Portal vein is patent on color Doppler imaging with normal direction of blood flow towards the liver. Incidental: Right pleural effusion evident. Small cyst identified adjacent to the right hepatic lobe, indeterminate. IMPRESSION: No intra or extrahepatic biliary duct dilatation. Electronically Signed   By: Misty Stanley M.D.   On: 05/11/2018 17:16        Scheduled Meds: . apixaban  2.5 mg Oral BID  . aspirin EC  81 mg Oral Daily  . atorvastatin  80 mg Oral QPM  . calcitRIOL  0.5 mcg Oral Once per day on Mon Tue Wed Thu Fri  . feeding supplement (ENSURE ENLIVE)  237 mL Oral BID BM  . furosemide  20 mg Oral Daily  . mouth rinse  15 mL Mouth Rinse BID  . metoprolol tartrate  25 mg Oral QID  . multivitamin with minerals  1 tablet Oral Daily  . nicotine  14 mg Transdermal Daily  .  pantoprazole  40 mg Oral Daily  . sodium bicarbonate  650 mg Oral TID   Continuous Infusions:   LOS: 4 days    Time spent: [place], MD [facility]  If 7PM-7AM, please contact night-coverage [email or web address] Password [facility] 05/13/2018, 11:32 AM

## 2018-05-13 NOTE — Progress Notes (Signed)
Phlebotomy called me to inform me that patient refused CBC and BMP labs to be drawn this morning x2  .  Sent a secured chat to Vernon MD

## 2018-05-13 NOTE — Progress Notes (Addendum)
Progress Note  Patient Name: Maureen Mendez Date of Encounter: 05/13/2018  Primary Cardiologist: [facility]  Subjective   Pt resting, no family at bedside. She states she feels better, breathing is better,  No further hemoptysis  Inpatient Medications    Scheduled Meds: . apixaban  2.5 mg Oral BID  . aspirin EC  81 mg Oral Daily  . atorvastatin  80 mg Oral QPM  . calcitRIOL  0.5 mcg Oral Once per day on Mon Tue Wed Thu Fri  . feeding supplement (ENSURE ENLIVE)  237 mL Oral BID BM  . furosemide  20 mg Oral Daily  . mouth rinse  15 mL Mouth Rinse BID  . metoprolol tartrate  25 mg Oral QID  . multivitamin with minerals  1 tablet Oral Daily  . nicotine  14 mg Transdermal Daily  . pantoprazole  40 mg Oral Daily  . sodium bicarbonate  650 mg Oral TID   Continuous Infusions:  PRN Meds: acetaminophen, diclofenac sodium, docusate sodium, guaiFENesin-dextromethorphan, hydrOXYzine, ipratropium, levalbuterol, polyethylene glycol, polyvinyl alcohol   Vital Signs    Vitals:   05/12/18 1236 05/12/18 2123 05/13/18 0052 05/13/18 0535  BP: (!) 117/92 (!) 128/92 (!) 123/92 125/86  Pulse: 81 94 89 86  Resp: 19 20 16 18   Temp: 98.2 F (36.8 C) (!) 97.3 F (36.3 C) (!) 97.3 F (36.3 C) (!) 97.5 F (36.4 C)  TempSrc: Oral Oral Oral Oral  SpO2: 98% (!) 72% 90% 91%  Weight:    45.4 kg  Height:        Intake/Output Summary (Last 24 hours) at 05/13/2018 0849 Last data filed at 05/13/2018 5027 Gross per 24 hour  Intake 100 ml  Output 300 ml  Net -200 ml   Last 3 Weights 05/13/2018 05/12/2018 05/10/2018  Weight (lbs) 100 lb 97 lb 8 oz 126 lb  Weight (kg) 45.36 kg 44.226 kg 57.153 kg      Telemetry    Atrial flutter in the 80s - Personally Reviewed  ECG    No new tracings - Personally Reviewed  Physical Exam   GEN: elderly appearing female, NAD Neck: minimal JVD Cardiac: RRR Respiratory: respirations unlabored, breath sounds better than yesterday GI: Soft, nontender, non-distended    MS: No edema; No deformity. Neuro:  Nonfocal  Psych: Normal affect   Labs    Chemistry Recent Labs  Lab 05/10/18 0405 05/11/18 0651 05/12/18 0455  NA 138 137 138  K 4.8 4.7 3.9  CL 108 106 105  CO2 17* 17* 21*  GLUCOSE 124* 111* 108*  BUN 62* 69* 68*  CREATININE 3.00* 2.96* 2.94*  CALCIUM 8.5* 8.4* 8.4*  PROT  --  5.8* 5.5*  ALBUMIN  --  2.6* 2.4*  AST  --  183* 193*  ALT  --  243* 255*  ALKPHOS  --  164* 150*  BILITOT  --  2.0* 1.6*  GFRNONAA 14* 14* 14*  GFRAA 16* 16* 16*  ANIONGAP 13 14 12      Hematology Recent Labs  Lab 05/08/18 0430 05/11/18 0651 05/12/18 0455  WBC 9.5 13.1* 10.5  RBC 4.32 4.04 3.98  HGB 12.7 11.8* 11.7*  HCT 39.7 36.3 35.0*  MCV 91.9 89.9 87.9  MCH 29.4 29.2 29.4  MCHC 32.0 32.5 33.4  RDW 22.9* 22.2* 22.0*  PLT 100* 95* 91*    Cardiac Enzymes Recent Labs  Lab 05/07/18 1815 05/07/18 2328 05/08/18 0430  TROPONINI 0.10* 0.11* 0.10*    Recent Labs  Lab 05/07/18 7412  TROPIPOC 0.12*     BNP Recent Labs  Lab 05/07/18 0857  BNP 3,324.2*     DDimer No results for input(s): DDIMER in the last 168 hours.   Radiology    Ct Head Wo Contrast  Result Date: 05/11/2018 CLINICAL DATA:  Loss of consciousness.  History of stroke. EXAM: CT HEAD WITHOUT CONTRAST TECHNIQUE: Contiguous axial images were obtained from the base of the skull through the vertex without intravenous contrast. COMPARISON:  None. FINDINGS: BRAIN: No intraparenchymal hemorrhage, mass effect nor midline shift. Moderate to severe parenchymal brain volume loss for age. No hydrocephalus. Confluent supratentorial white matter hypodensities. Old RIGHT thalamus infarct. Heterogeneous basal ganglia seen with chronic small vessel ischemic changes. Old RIGHT cerebellar infarcts. No acute large vascular territory infarcts. No abnormal extra-axial fluid collections. Basal cisterns are patent. VASCULAR: Moderate to severe calcific atherosclerosis of the carotid siphons. SKULL:  No skull fracture. No significant scalp soft tissue swelling. SINUSES/ORBITS: Trace paranasal sinus mucosal thickening. Mastoid air cells are well aerated.The included ocular globes and orbital contents are non-suspicious. Status post RIGHT ocular lens implant. OTHER: None. IMPRESSION: 1. No acute intracranial process. 2. Severe chronic small vessel ischemic changes. Old RIGHT thalamus and RIGHT cerebellar infarcts. 3. Moderate to severe parenchymal brain volume loss. Electronically Signed   By: Elon Alas M.D.   On: 05/11/2018 16:03   Dg Chest Port 1 View  Result Date: 05/13/2018 CLINICAL DATA:  Hypoxia EXAM: PORTABLE CHEST 1 VIEW COMPARISON:  05/07/2018 FINDINGS: Cardiac shadow remains enlarged. The lungs are well aerated bilaterally. New density is noted along the lateral chest wall inferiorly on the left consistent with early infiltrate. No sizable effusion is noted. No acute bony abnormality is noted. IMPRESSION: New pleural based density on the left inferiorly likely related to early infiltrate. Electronically Signed   By: Inez Catalina M.D.   On: 05/13/2018 08:18   US Liver Doppler  Result Date: 05/11/2018 CLINICAL DATA:  Elevated LFTs EXAM: DUPLEX ULTRASOUND OF LIVER TECHNIQUE: Color and duplex Doppler ultrasound was performed to evaluate the hepatic in-flow and out-flow vessels. COMPARISON:  None. FINDINGS: Liver: Heterogeneous echotexture. Normal hepatic contour without nodularity. No focal lesion, mass or intrahepatic biliary ductal dilatation. Main Portal Vein size: 0.7 cm Portal Vein Velocities Main Prox:  26.7 cm/sec Main Mid: 27.3 cm/sec Main Dist:  33.7 cm/sec Right: 24.2 cm/sec Left: 22.6 cm/sec Hepatic Vein Velocities Right:  Is 28.1 cm/sec Middle:  28.4 cm/sec Left:  29.5 cm/sec IVC: Present and patent with normal respiratory phasicity. Hepatic Artery Velocity:  55.6 cm/sec Splenic Vein Velocity:  36.7 cm/sec Spleen: 2.7 cm x 4.2 cm x 2.0 cm with a total volume of 11.8 cm^3 (411 cm^3  is upper limit normal) Portal Vein Occlusion/Thrombus: No Splenic Vein Occlusion/Thrombus: No Ascites: None Varices: None Small cyst seen in the kidneys bilaterally. Bilateral pleural effusions noted. IMPRESSION: No evidence of portal vein thrombosis or hypertension. Electronically Signed   By: Rolm Baptise M.D.   On: 05/11/2018 17:36   US Abdomen Limited Ruq  Result Date: 05/11/2018 CLINICAL DATA:  Elevated LFTs. EXAM: ULTRASOUND ABDOMEN LIMITED RIGHT UPPER QUADRANT COMPARISON:  None. FINDINGS: Gallbladder: No gallstones or gallbladder wall thickening. No pericholecystic fluid. The sonographer reports no sonographic Murphy's sign. Common bile duct: Diameter: 2 mm Liver: No focal lesion identified. Within normal limits in parenchymal echogenicity. Portal vein is patent on color Doppler imaging with normal direction of blood flow towards the liver. Incidental: Right pleural effusion evident. Small cyst identified adjacent to the right  hepatic lobe, indeterminate. IMPRESSION: No intra or extrahepatic biliary duct dilatation. Electronically Signed   By: Misty Stanley M.D.   On: 05/11/2018 17:16    Cardiac Studies   Echo 04/08/18: Study Conclusions - Left ventricle: The cavity size was normal. There was severe concentric hypertrophy. Systolic function was moderately reduced. The estimated ejection fraction was in the range of 35% to 40%. Diffuse hypokinesis. Doppler parameters are consistent with pseudonormal left ventricular relaxation (grade 2 diastolic dysfunction). The E/e&' ratio is >15, suggesting elevated LV filling pressure. - Mitral valve: Mildly thickened leaflets . There was mild regurgitation. - Left atrium: Severely dilated. - Right ventricle: The cavity size was mildly dilated. Moderately reduced systolic function. - Right atrium: Severely dilated. - Tricuspid valve: There was mild regurgitation. - Pulmonary arteries: PA peak pressure: 45 mm Hg (S). - Inferior vena  cava: The vessel was dilated. The respirophasic diameter changes were blunted (<50%), consistent with elevated central venous pressure.  Impressions: - LVEF 35-40%, severe LVH, global hypokinesis, grade 2 DD, elevated LV filling pressure, mild MR, severe LAE, mildly dilated RV with moderate systolic dysfunction, severe RAE, mild TR, RVSP 45 mmHg, dilated IVC.  Patient Profile     85 y.o. female with acute on chronic systolic heart failure, paroxysmal atrial fibrillation/flutter, chronic kidney disease stage IV, hypertension, history of apical thrombus on Eliquis, ventricular tachycardia prior refusal of ICD. EF 25% previously, now 35-40%.  Assessment & Plan    1. Acute on chronic systolic heart failure - echo 03/2018 with Ef of 35-40%, previously 25% - moderate RV dysfunction - she has done well on 20 mg lasix daily for the past 2 days - she is overall net negative 2L, but urine output appears to be down - weight is 100 lbs down from 123 lbs at discharge - she remains on 3-4L [place], she is on home O2  2. CKD stage IV - sCr pending today - baseline appears to be  2.6-2.9  3. Hx of CVA and apical thrombus  4. Atrial flutter with variable AV conduction - hear rates in the 80s - on low dose eliquis - didn't tolerate amiodarone (liver toxicity) - no further bleeding or hemoptysis - Hb pending for today  5. Elevated troponin - felt to be demand ischemia in the setting of heart failure - no further workup  6. Hypertension - pressures are controlled - no medication changes  Palliative care following. Difficult situation given her dementia and clinical picture.  For questions or updates, please contact [place] Please consult [email or web address] for contact info under     Signed, Ledora Bottcher, PA  05/13/2018, 8:49 AM    The patient was seen, examined and discussed with Minette Brine , PA-C and I agree with the above.   85 year old female with underlying  dementia, was admitted with acute on chronic systolic heart failure CKD stage IV, history of CVA, with apical thrombus on low-dose Eliquis, persistent atrial flutter, elevated troponin, the patient is slowly improving with regards to heart failure, she was switched to oral diuretics on 2/23, she has stable weights, with negative fluid balance, we will continue p.o. Lasix.  Resolved hemoptysis, stable Hb 11.7.  Overall prognosis very poor given that she has multiorgan failure, worsening liver failure, CKD stage 4, and advanced dementia.  Her daughter who is a nurse wants to continue full code.  Palliative care follows along, will continue full support until decided otherwise.  [facility] will sign off.   Medication Recommendations:  As above Other recommendations (labs, testing, etc):  No further testing Follow up as an outpatient:  As needed  Ena Dawley, MD 05/13/2018

## 2018-05-13 NOTE — Progress Notes (Signed)
Nutrition Follow-up  DOCUMENTATION CODES:   Severe malnutrition in context of chronic illness  INTERVENTION:   -Continue MVI with minerals daily -Continue dysphagia 2 (mechanical soft) diet with thin liquids for ease of intake due to poor dentition -Continue Ensure Enlive po BID, each supplement provides 350 kcal and 20 grams of protein -Continue Magic Cup BID with meals, each supplement provides 290 kcals and 9 grams protein  NUTRITION DIAGNOSIS:   Severe Malnutrition related to chronic illness(COPD) as evidenced by severe muscle depletion, severe fat depletion.  Ongoing  GOAL:   Patient will meet greater than or equal to 90% of their needs  Progressing  MONITOR:   PO intake, Supplement acceptance, Labs, Weight trends, Skin, I & O's  REASON FOR ASSESSMENT:   Consult Assessment of nutrition requirement/status  ASSESSMENT:   85 y/o female PMHx CKD4, Recent CVA, CHF, COPD, GERD, Afib, ongoing tobacco abuse. Recent admission 1/19-1/23 for COPD exacerbation. Pt presents w/ increasing weakness and SOB x3 days. Has had associated poor appetite. Workup significant for proBNP >3K and hypokalemia. Admitted for management.   Reviewed I/O's: -200 ml x 24 hours and -2 L since admission  Pt receiving personal care at time of visit. Spoke with nurse tech, who reports pt consumed about 50% of her lunch today.   Case discussed with RN, who confirms that pt's intake is variable secondary to mental status. Pt is more alert today and consumed between 25-505 of breakfast meal. RN has been providing Ensure supplements with medications and pt usually consumed about 75% of supplement.    Due to advanced age and dementia, long term feeding tube would not benefit pt. Spoke with CSW, who reports that pt will likely go home with home health services (pt with guardianship hearing with high possibility that pt daughter will obtain guardianship of pt).   Palliative care following; per family meeting,  desires continued aggressive treatment.   Labs reviewed.   Diet Order:   Diet Order            DIET DYS 2 Room service appropriate? Yes; Fluid consistency: Thin; Fluid restriction: 1500 mL Fluid  Diet effective now              EDUCATION NEEDS:   Education needs have been addressed  Skin:  Skin Assessment: Reviewed RN Assessment  Last BM:  05/12/18  Height:   Ht Readings from Last 1 Encounters:  05/07/18 5\' 3"  (1.6 m)    Weight:   Wt Readings from Last 1 Encounters:  05/13/18 45.4 kg    Ideal Body Weight:  52.3 kg  BMI:  Body mass index is 17.71 kg/m.  Estimated Nutritional Needs:   Kcal:  1450-1650  Protein:  60-75 grams  Fluid:  > 1.4 L    Mckenzie Toruno A. Jimmye Norman, RD, LDN, CDE Pager: [phone] After hours Pager: [phone]

## 2018-05-13 NOTE — Progress Notes (Signed)
Physical Therapy Treatment Patient Details Name: Maureen Mendez MRN: [id number] DOB: December 31, 1929 Today's Date: 05/13/2018    History of Present Illness Pt is an 85 y.o. female admitted 05/07/18 with SOB; worked up for CHF exacerbation. PMH includes CKD IV, stroke, CHF, COPD, asthma, afib. Of note, recent admission for COPD.    PT Comments    Pt currently requiring maxA to stand and initiate steps with RW; +2 assist for safety with ADLs, dependent for pericare. Pt more alert with eyes open, but not consistently answering questions or following commands. Per chart, daughter declined recommendation for SNF-level therapies. If pt to return home, will require at least a wheelchair and 24/7 assist. Continue to recommend SNF.    Follow Up Recommendations  SNF;Supervision for mobility/OOB(if daughter continues to decline SNF, will require wheelchair and 24/7 physical assist at home)     Equipment Recommendations  (TBD)    Recommendations for Other Services       Precautions / Restrictions Precautions Precautions: Fall Precaution Comments: On 2L O2 [place] Restrictions Weight Bearing Restrictions: No    Mobility  Bed Mobility Overal bed mobility: Needs Assistance Bed Mobility: Sit to Supine       Sit to supine: Mod assist   General bed mobility comments: ModA to assist BLEs into supine; totalA to scoot up in bed  Transfers Overall transfer level: Needs assistance Equipment used: Rolling walker (2 wheeled) Transfers: Sit to/from Stand Sit to Stand: Max assist         General transfer comment: Received sitting on BSC. MaxA to stand from [facility] to RW, pt unable to achieve fully upright despite cues. Frequently taking LUE off RW to place on bed, requiring repeated cues to hold RW  Ambulation/Gait Ambulation/Gait assistance: Max assist;+2 safety/equipment Gait Distance (Feet): 2 Feet Assistive device: Rolling walker (2 wheeled) Gait Pattern/deviations: Step-to pattern;Shuffle;Decreased  dorsiflexion - left;Decreased dorsiflexion - right;Decreased weight shift to right;Decreased weight shift to left;Trunk flexed;Leaning posteriorly Gait velocity: Decreased   General Gait Details: Slow, unstable steps from [facility] to recliner with RW and maxA to maintain balance and upright standing. Pt difficulty shifting weight in order to take complete steps; cues throughout, and cues to prevent premature sitting. MaxA for safe eccentric control   Stairs             Wheelchair Mobility    Modified Rankin (Stroke Patients Only)       Balance Overall balance assessment: Needs assistance   Sitting balance-Leahy Scale: Fair       Standing balance-Leahy Scale: Zero Standing balance comment: MaxA and UE support to maintain standing balance; +2 assist required for pericare while standing                            Cognition Arousal/Alertness: Awake/alert Behavior During Therapy: Flat affect Overall Cognitive Status: History of cognitive impairments - at baseline                                 General Comments: Pt more alert this session, although verbalizing minimally, slow to respond and move. Not answering all questions, still seems confused      Exercises      General Comments General comments (skin integrity, edema, etc.): Unable to get pulse ox read; pt on O2 [place]      Pertinent Vitals/Pain Pain Assessment: No/denies pain    Home Living  Prior Function            PT Goals (current goals can now be found in the care plan section) Acute Rehab PT Goals Patient Stated Goal: Niece present and interested in rehab at SNF, but unsure if pt will agree to participate in daily PT PT Goal Formulation: With family Time For Goal Achievement: 05/22/18 Progress towards PT goals: Progressing toward goals    Frequency    Min 2X/week      PT Plan Current plan remains appropriate    Co-evaluation               AM-PAC PT "6 Clicks" Mobility   Outcome Measure  Help needed turning from your back to your side while in a flat bed without using bedrails?: A Lot Help needed moving from lying on your back to sitting on the side of a flat bed without using bedrails?: A Lot Help needed moving to and from a bed to a chair (including a wheelchair)?: A Lot Help needed standing up from a chair using your arms (e.g., wheelchair or bedside chair)?: A Lot Help needed to walk in hospital room?: Total Help needed climbing 3-5 steps with a railing? : Total 6 Click Score: 10    End of Session Equipment Utilized During Treatment: Oxygen;Gait belt Activity Tolerance: Patient limited by lethargy Patient left: in bed;with call bell/phone within reach;with family/visitor present Nurse Communication: Mobility status PT Visit Diagnosis: Other abnormalities of gait and mobility (R26.89);Muscle weakness (generalized) (M62.81)     Time: 1050-1105 PT Time Calculation (min) (ACUTE ONLY): 15 min  Charges:  $Therapeutic Activity: 8-22 mins                    Mabeline Caras, PT, DPT Acute Rehabilitation Services  Pager [phone] Office [phone]  Derry Lory 05/13/2018, 11:50 AM

## 2018-05-13 NOTE — Clinical Social Work Note (Addendum)
Called and spoke with [facility] APS representative. Patient's hearing is not until 3:30 today. Left voicemail for social worker assigned to her case.  Dayton Scrape, [place] [phone]  2:22 pm Received call back from [place] worker. He stated today's hearing is for incompetency and guardianship. Social worker said patient will probably be deemed incompetent and daughter will likely obtain guardianship. He will have her bring the paperwork to the hospital once obtained.  Dayton Scrape, [place]

## 2018-05-13 NOTE — Progress Notes (Signed)
Daily Progress Note   Patient Name: Maureen Mendez       Date: 05/13/2018 DOB: 22-Mar-1929  Age: 85 y.o. MRN#: [id number] Attending Physician: Cristy Folks, MD Primary Care Physician: Patient, No Pcp Per Admit Date: 05/07/2018  Reason for Consultation/Follow-up: Establishing goals of care  Subjective: As I entered the room the patient's niece shows me the patient's bloody sputum on a damp wash cloth - what is this? It scared me!  We talked for a short while about heart failure and hemoptysis.  Then I spoke to the patient.  She tells me she's cold but denies SOB, pain, or discomfort.  She says the blood she coughed up does not frighten her.  The niece asks me to return when the patient's daughter arrives.   Assessment: Elderly frail female still with hemoptysis likely from mixed heart failure.  She is on aspirin and eliquis given her hx of multiple strokes.  Patient is able to walk 2' with max assist.  Her albumin is 2.4.  She appears very deconditioned. Per Epic documentation she is not eating or drinking well (bites and sips).  Patient Profile/HPI:  85 y.o. female  with past medical history of COPD, GERD, atrial fib, CVA, chronic kidney disease stage IV, CHF with EF 35% admitted on 05/07/2018 with worsening lethargy as well as shortness of breath.    Length of Stay: 4  Current Medications: Scheduled Meds:  . apixaban  2.5 mg Oral BID  . aspirin EC  81 mg Oral Daily  . atorvastatin  80 mg Oral QPM  . calcitRIOL  0.5 mcg Oral Once per day on Mon Tue Wed Thu Fri  . feeding supplement (ENSURE ENLIVE)  237 mL Oral BID BM  . furosemide  20 mg Oral Daily  . mouth rinse  15 mL Mouth Rinse BID  . metoprolol tartrate  25 mg Oral QID  . multivitamin with minerals  1 tablet Oral Daily  .  nicotine  14 mg Transdermal Daily  . pantoprazole  40 mg Oral Daily  . sodium bicarbonate  650 mg Oral TID    Continuous Infusions:   PRN Meds: acetaminophen, diclofenac sodium, docusate sodium, guaiFENesin-dextromethorphan, hydrOXYzine, ipratropium, levalbuterol, polyethylene glycol, polyvinyl alcohol  Physical Exam       Elderly frail female, pleasantly demented, lying on her side,  awake, alert, responsive but appears exhausted and slow. CV rrr resp no distress on N/C Abdomen soft, nt, nd Lower ext trace edema  Vital Signs: BP 110/78 (BP Location: Left Arm)   Pulse 85   Temp 98 F (36.7 C) (Oral)   Resp 18   Ht 5\' 3"  (1.6 m)   Wt 45.4 kg   SpO2 90%   BMI 17.71 kg/m  SpO2: SpO2: 90 % O2 Device: O2 Device: Nasal Cannula O2 Flow Rate: O2 Flow Rate (L/min): 3.5 L/min  Intake/output summary:   Intake/Output Summary (Last 24 hours) at 05/13/2018 1429 Last data filed at 05/13/2018 0370 Gross per 24 hour  Intake 100 ml  Output 301 ml  Net -201 ml   LBM: Last BM Date: 05/12/18 Baseline Weight: Weight: 47.6 kg Most recent weight: Weight: 45.4 kg       Palliative Assessment/Data: 30%    Patient Active Problem List   Diagnosis Date Noted  . Elevated LFTs   . Acute respiratory failure with hypoxia ([place]) 05/10/2018  . Goals of care, counseling/discussion   . Palliative care by specialist   . Acute on chronic congestive heart failure ([place]) 05/09/2018  . Protein-calorie malnutrition, severe 05/09/2018  . Chronic systolic CHF (congestive heart failure) ([place]) 05/07/2018  . Smoker 05/07/2018  . Paroxysmal A-fib ([place]) 05/07/2018  . CKD (chronic kidney disease) stage 4, GFR 15-29 ml/min (HCC) 04/10/2018  . COPD with acute exacerbation ([place]) 04/06/2018  . CAD (coronary artery disease) 04/06/2018  . CVA (cerebral vascular accident) ([place]) 04/06/2018  . Elevated troponin 04/06/2018    Palliative Care Plan    Recommendations/Plan:  Will meet with daughter after court  hearing to continue to assist with [place].  Appropriate for Palliative Care or hospice to follow outpatient pending GOC.  Goals of Care and Additional Recommendations:  Limitations on Scope of Treatment: Full Scope Treatment  Code Status:  Full code.  Medically DNR is recommended.  Prognosis:   < 6 months   Discharge Planning:  [place] for rehab with Palliative care service follow-up  Care plan was discussed with attending physician.  Thank you for allowing the Palliative Medicine Team to assist in the care of this patient.  Total time spent:  35 min.     Greater than 50%  of this time was spent counseling and coordinating care related to the above assessment and plan.  Florentina Jenny, PA-C Palliative Medicine  Please contact Palliative MedicineTeam phone at [phone] for questions and concerns between 7 am - 7 pm.   Please see AMION for individual provider pager numbers.

## 2018-05-14 LAB — CBC
HCT: 39.8 % (ref 36.0–46.0)
Hemoglobin: 13 g/dL (ref 12.0–15.0)
MCH: 28.9 pg (ref 26.0–34.0)
MCHC: 32.7 g/dL (ref 30.0–36.0)
MCV: 88.4 fL (ref 80.0–100.0)
Platelets: 88 10*3/uL — ABNORMAL LOW (ref 150–400)
RBC: 4.5 MIL/uL (ref 3.87–5.11)
RDW: 21.6 % — ABNORMAL HIGH (ref 11.5–15.5)
WBC: 12.1 10*3/uL — ABNORMAL HIGH (ref 4.0–10.5)
nRBC: 1.2 % — ABNORMAL HIGH (ref 0.0–0.2)

## 2018-05-14 LAB — COMPREHENSIVE METABOLIC PANEL
ALT: 216 U/L — ABNORMAL HIGH (ref 0–44)
AST: 125 U/L — ABNORMAL HIGH (ref 15–41)
Albumin: 2.2 g/dL — ABNORMAL LOW (ref 3.5–5.0)
Alkaline Phosphatase: 139 U/L — ABNORMAL HIGH (ref 38–126)
Anion gap: 13 (ref 5–15)
BUN: 60 mg/dL — ABNORMAL HIGH (ref 8–23)
CO2: 23 mmol/L (ref 22–32)
Calcium: 8.1 mg/dL — ABNORMAL LOW (ref 8.9–10.3)
Chloride: 103 mmol/L (ref 98–111)
GFR calc Af Amer: 20 mL/min — ABNORMAL LOW (ref >60)
GFR calc non Af Amer: 17 mL/min — ABNORMAL LOW (ref >60)
Glucose, Bld: 120 mg/dL — ABNORMAL HIGH (ref 70–99)
Potassium: 3.9 mmol/L (ref 3.5–5.1)
Sodium: 139 mmol/L (ref 135–145)
Total Bilirubin: 1.2 mg/dL (ref 0.3–1.2)
Total Protein: 5.3 g/dL — ABNORMAL LOW (ref 6.5–8.1)

## 2018-05-14 LAB — COMPREHENSIVE METABOLIC PANEL WITH GFR: Creatinine, Ser: 2.5 mg/dL — ABNORMAL HIGH (ref 0.44–1.00)

## 2018-05-14 LAB — MAGNESIUM: Magnesium: 2.1 mg/dL (ref 1.7–2.4)

## 2018-05-14 MED ORDER — CEFDINIR 300 MG PO CAPS
300.0000 mg | ORAL_CAPSULE | ORAL | Status: DC
  Administered 2018-05-14 – 2018-05-15 (×2): 300 mg via ORAL
  Filled 2018-05-14 (×3): qty 1

## 2018-05-14 NOTE — Clinical Social Work Note (Signed)
Daughter's legal guardian paperwork is on chart.  Maureen Mendez, [place]

## 2018-05-14 NOTE — Progress Notes (Signed)
PHARMACY NOTE:  ANTIMICROBIAL RENAL DOSAGE ADJUSTMENT  Current antimicrobial regimen includes a mismatch between antimicrobial dosage and estimated renal function.  As per policy approved by the Pharmacy & Therapeutics and Medical Executive Committees, the antimicrobial dosage will be adjusted accordingly.  Current antimicrobial dosage:  Cefdinir 300mg  BID   Renal Function:  Estimated Creatinine Clearance: 11.7 mL/min (A) (by C-G formula based on SCr of 2.5 mg/dL (H)). []      On intermittent HD, scheduled: []      On CRRT    Antimicrobial dosage has been changed to:  Cefdinir 300mg  q24h  Additional comments:   Vania Rosero A. Levada Dy, PharmD, [place] Please utilize Amion for appropriate phone number to reach the unit pharmacist ([place])   05/14/2018 2:49 PM

## 2018-05-14 NOTE — Progress Notes (Addendum)
PROGRESS NOTE    Maureen Mendez  ACZ:[id number] DOB: Aug 12, 1929 DOA: 05/07/2018 PCP: Patient, No Pcp Per     Brief Narrative:  Maureen Mendez is an 85 year old with past medical history relevant for stage IV chronic kidney disease, history of CVA and apical thrombus as well as and paroxysmal atrial fibrillation on apixaban, chronic systolic heart failure EF of 35 to 40% with grade 2 diastolic dysfunction by echo on 04/08/2018, active smoker, who presented on 05/07/2018 with shortness of breath that was felt to be multifactorial, including acute on chronic systolic heart failure.  Her hospital course has been complicated by acute metabolic encephalopathy.  New events last 24 hours / Subjective: No acute events overnight.  Patient remains alert, oriented to self only.  She answers questions only intermittently.  Assessment & Plan:   Principal Problem:   Acute respiratory failure with hypoxia (HCC) Active Problems:   CAD (coronary artery disease)   CVA (cerebral vascular accident) ([place])   Elevated troponin   CKD (chronic kidney disease) stage 4, GFR 15-29 ml/min (HCC)   Chronic systolic CHF (congestive heart failure) (HCC)   Smoker   Paroxysmal A-fib (HCC)   Acute on chronic congestive heart failure (HCC)   Protein-calorie malnutrition, severe   Goals of care, counseling/discussion   Palliative care by specialist   Elevated LFTs   Palliative care encounter   Acute metabolic encephalopathy -Head CT unremarkable except for severe chronic small vessel disease, ammonia normal -Dementia at baseline -Avoid sedating medications  Acute on chronic systolic heart failure  -Appreciate cardiology consultation, transition to oral furosemide -Strict ins and outs, weigh daily -Continue metoprolol -Patient cannot tolerate ACE inhibitor due to AKI/CKD  Chronic hypoxemic respiratory failure -Continue nasal cannula O2  Left lower lobe pneumonia -CXR: New pleural based density on the left  inferiorly likely related to early infiltrate -Omnicef x 5 days   Elevated LFTs -Right upper quadrant ultrasound: No intra or extrahepatic biliary duct dilatation. -Liver Doppler ultrasound: No evidence of portal vein thrombosis or hypertension. -Ammonia normal -LFT decreased, continue to trend  Stage IV CKD -Baseline creatinine ~2.5 -Hold nephrotoxins -Stable  COPD -Not exacerbation. Continue short-acting bronchodilators  History of CVA/left apical thrombus/paroxysmal atrial fibrillation -Continue apixaban, metoprolol  Hypertension -Continue metoprolol   Hyperlipidemia -Continue atorvastatin   Tobacco abuse -Nicotine patch  GERD -Continue PPI   DVT prophylaxis: Eliquis Code Status: Full code Family Communication: No family at bedside, attempted to reach daughter without answer, left voicemail to call back Disposition Plan: Discharge soon, will continue to get hold of daughter for discharge planning.  They have recently declined SNF placement.  Addendum: Spoke with daughter over the phone regarding patient's poor prognosis, I do believe she is hospice appropriate and could pursue hospice at home for maximal quality of life. Plan to meet with patient's daughter tomorrow morning to continue goals of care conversation.    Consultants:   Cardiology  Palliative care  Procedures:   None   Antimicrobials:  Anti-infectives (From admission, onward)   None        Objective: Vitals:   05/13/18 1944 05/14/18 0440 05/14/18 0907 05/14/18 1219  BP: (!) 131/95 111/90 99/82 112/88  Pulse: 90 75 81 78  Resp: 20 18    Temp: (!) 97.4 F (36.3 C) 97.7 F (36.5 C)  97.8 F (36.6 C)  TempSrc: Oral Oral  Oral  SpO2: 99% 92%  94%  Weight:  45.8 kg    Height:        Intake/Output  Summary (Last 24 hours) at 05/14/2018 1428 Last data filed at 05/14/2018 0907 Gross per 24 hour  Intake 770 ml  Output 200 ml  Net 570 ml   Filed Weights   05/12/18 0658 05/13/18  0535 05/14/18 0440  Weight: 44.2 kg 45.4 kg 45.8 kg    Examination:  General exam: Appears calm and comfortable  Respiratory system: Clear to auscultation. Respiratory effort normal. Cardiovascular system: S1 & S2 heard, irreg rhythm rate 70s  Gastrointestinal system: Abdomen is nondistended, soft and nontender. No organomegaly or masses felt. Normal bowel sounds heard. Central nervous system: Alert  Extremities: Symmetric  Skin: No rashes, lesions or ulcers Psychiatry: Dementia   Data Reviewed: I have personally reviewed following labs and imaging studies  CBC: Recent Labs  Lab 05/08/18 0430 05/11/18 0651 05/12/18 0455 05/14/18 0833  WBC 9.5 13.1* 10.5 12.1*  HGB 12.7 11.8* 11.7* 13.0  HCT 39.7 36.3 35.0* 39.8  MCV 91.9 89.9 87.9 88.4  PLT 100* 95* 91* 88*   Basic Metabolic Panel: Recent Labs  Lab 05/09/18 0712 05/10/18 0405 05/11/18 0651 05/12/18 0455 05/14/18 0833  NA 135 138 137 138 139  K 5.0 4.8 4.7 3.9 3.9  CL 107 108 106 105 103  CO2 18* 17* 17* 21* 23  GLUCOSE 134* 124* 111* 108* 120*  BUN 59* 62* 69* 68* 60*  CREATININE 2.98* 3.00* 2.96* 2.94* 2.50*  CALCIUM 8.4* 8.5* 8.4* 8.4* 8.1*  MG  --   --  2.3  --  2.1   GFR: Estimated Creatinine Clearance: 11.7 mL/min (A) (by C-G formula based on SCr of 2.5 mg/dL (H)). Liver Function Tests: Recent Labs  Lab 05/11/18 0651 05/12/18 0455 05/14/18 0833  AST 183* 193* 125*  ALT 243* 255* 216*  ALKPHOS 164* 150* 139*  BILITOT 2.0* 1.6* 1.2  PROT 5.8* 5.5* 5.3*  ALBUMIN 2.6* 2.4* 2.2*   No results for input(s): LIPASE, AMYLASE in the last 168 hours. Recent Labs  Lab 05/11/18 0940  AMMONIA 29   Coagulation Profile: No results for input(s): INR, PROTIME in the last 168 hours. Cardiac Enzymes: Recent Labs  Lab 05/07/18 1815 05/07/18 2328 05/08/18 0430  TROPONINI 0.10* 0.11* 0.10*   BNP (last 3 results) No results for input(s): PROBNP in the last 8760 hours. HbA1C: No results for input(s): HGBA1C  in the last 72 hours. CBG: Recent Labs  Lab 05/08/18 2135  GLUCAP 167*   Lipid Profile: No results for input(s): CHOL, HDL, LDLCALC, TRIG, CHOLHDL, LDLDIRECT in the last 72 hours. Thyroid Function Tests: No results for input(s): TSH, T4TOTAL, FREET4, T3FREE, THYROIDAB in the last 72 hours. Anemia Panel: No results for input(s): VITAMINB12, FOLATE, FERRITIN, TIBC, IRON, RETICCTPCT in the last 72 hours. Sepsis Labs: No results for input(s): PROCALCITON, LATICACIDVEN in the last 168 hours.  No results found for this or any previous visit (from the past 240 hour(s)).     Radiology Studies: Dg Chest Port 1 View  Result Date: 05/13/2018 CLINICAL DATA:  Hypoxia EXAM: PORTABLE CHEST 1 VIEW COMPARISON:  05/07/2018 FINDINGS: Cardiac shadow remains enlarged. The lungs are well aerated bilaterally. New density is noted along the lateral chest wall inferiorly on the left consistent with early infiltrate. No sizable effusion is noted. No acute bony abnormality is noted. IMPRESSION: New pleural based density on the left inferiorly likely related to early infiltrate. Electronically Signed   By: Inez Catalina M.D.   On: 05/13/2018 08:18      Scheduled Meds: . apixaban  2.5 mg  Oral BID  . aspirin EC  81 mg Oral Daily  . atorvastatin  80 mg Oral QPM  . calcitRIOL  0.5 mcg Oral Once per day on Mon Tue Wed Thu Fri  . feeding supplement (ENSURE ENLIVE)  237 mL Oral BID BM  . furosemide  20 mg Oral Daily  . mouth rinse  15 mL Mouth Rinse BID  . metoprolol tartrate  25 mg Oral QID  . multivitamin with minerals  1 tablet Oral Daily  . nicotine  14 mg Transdermal Daily  . pantoprazole  40 mg Oral Daily  . sodium bicarbonate  650 mg Oral TID   Continuous Infusions:   LOS: 5 days    Time spent: 40 minutes   Dessa Phi, DO [facility] [email or web address] 05/14/2018, 2:28 PM

## 2018-05-15 ENCOUNTER — Ambulatory Visit: Payer: Medicare Other | Admitting: Family Medicine

## 2018-05-15 LAB — COMPREHENSIVE METABOLIC PANEL
ALT: 165 U/L — ABNORMAL HIGH (ref 0–44)
AST: 74 U/L — ABNORMAL HIGH (ref 15–41)
Albumin: 2.1 g/dL — ABNORMAL LOW (ref 3.5–5.0)
Alkaline Phosphatase: 125 U/L (ref 38–126)
Anion gap: 10 (ref 5–15)
BUN: 61 mg/dL — ABNORMAL HIGH (ref 8–23)
CO2: 23 mmol/L (ref 22–32)
Calcium: 8.2 mg/dL — ABNORMAL LOW (ref 8.9–10.3)
Chloride: 105 mmol/L (ref 98–111)
Creatinine, Ser: 2.47 mg/dL — ABNORMAL HIGH (ref 0.44–1.00)
GFR calc Af Amer: 20 mL/min — ABNORMAL LOW (ref >60)
GFR calc non Af Amer: 17 mL/min — ABNORMAL LOW (ref >60)
Glucose, Bld: 121 mg/dL — ABNORMAL HIGH (ref 70–99)
POTASSIUM: 3.9 mmol/L (ref 3.5–5.1)
Sodium: 138 mmol/L (ref 135–145)
Total Bilirubin: 1.2 mg/dL (ref 0.3–1.2)
Total Protein: 4.9 g/dL — ABNORMAL LOW (ref 6.5–8.1)

## 2018-05-15 LAB — CBC
HCT: 39.5 % (ref 36.0–46.0)
Hemoglobin: 13.3 g/dL (ref 12.0–15.0)
MCH: 29.8 pg (ref 26.0–34.0)
MCHC: 33.7 g/dL (ref 30.0–36.0)
MCV: 88.6 fL (ref 80.0–100.0)
Platelets: 84 10*3/uL — ABNORMAL LOW (ref 150–400)
RBC: 4.46 MIL/uL (ref 3.87–5.11)
RDW: 21.8 % — AB (ref 11.5–15.5)
WBC: 12.1 10*3/uL — ABNORMAL HIGH (ref 4.0–10.5)
nRBC: 0.9 % — ABNORMAL HIGH (ref 0.0–0.2)

## 2018-05-15 MED ORDER — SODIUM BICARBONATE 650 MG PO TABS: 650.0000 mg | ORAL_TABLET | Freq: Three times a day (TID) | ORAL | 0 refills | Status: AC

## 2018-05-15 MED ORDER — FUROSEMIDE 20 MG PO TABS: 20.0000 mg | ORAL_TABLET | Freq: Every day | ORAL | 0 refills | Status: AC

## 2018-05-15 MED ORDER — METOPROLOL TARTRATE 25 MG PO TABS: 25.0000 mg | ORAL_TABLET | Freq: Four times a day (QID) | ORAL | 0 refills | Status: AC

## 2018-05-15 MED ORDER — ELIQUIS 2.5 MG PO TABS: 2.5000 mg | ORAL_TABLET | Freq: Two times a day (BID) | ORAL | 0 refills | Status: AC

## 2018-05-15 MED ORDER — CEFDINIR 300 MG PO CAPS: 300.0000 mg | ORAL_CAPSULE | ORAL | 0 refills | Status: AC

## 2018-05-15 NOTE — Progress Notes (Signed)
Nutrition Follow-up  DOCUMENTATION CODES:   Severe malnutrition in context of chronic illness  INTERVENTION:   -Continue MVI with minerals daily -Continue dysphagia 2 (mechanical soft) diet with thin liquids for ease of intake due to poor dentition -Continue Ensure Enlive po BID, each supplement provides 350 kcal and 20 grams of protein -Continue Magic Cup BID with meals, each supplement provides 290 kcals and 9 grams protein  NUTRITION DIAGNOSIS:   Severe Malnutrition related to chronic illness(COPD) as evidenced by severe muscle depletion, severe fat depletion.  Ongoing  GOAL:   Patient will meet greater than or equal to 90% of their needs  Progressing   MONITOR:   PO intake, Supplement acceptance, Labs, Weight trends, Skin, I & O's  REASON FOR ASSESSMENT:   Consult Assessment of nutrition requirement/status  ASSESSMENT:   85 y/o female PMHx CKD4, Recent CVA, CHF, COPD, GERD, Afib, ongoing tobacco abuse. Recent admission 1/19-1/23 for COPD exacerbation. Pt presents w/ increasing weakness and SOB x3 days. Has had associated poor appetite. Workup significant for proBNP >3K and hypokalemia. Admitted for management.   Reviewed I/O's: +700 ml x 24 hours and -413 ml since admission  Case discussed with RN, who reports that pt has been very sleepy today. She confirms that intake waxes and wanes with mental status. Palliative care team to speak with family this morning to firm goals of care; will likely discharge home with home hospice.   Intake has improved since last visit, but still poor. Meal completion 20-50%. Pt is tolerating Ensure supplements well.   Labs reviewed.   Diet Order:   Diet Order            DIET DYS 2 Room service appropriate? Yes; Fluid consistency: Thin; Fluid restriction: 1500 mL Fluid  Diet effective now              EDUCATION NEEDS:   Education needs have been addressed  Skin:  Skin Assessment: Reviewed RN Assessment  Last BM:   05/12/18  Height:   Ht Readings from Last 1 Encounters:  05/07/18 5\' 3"  (1.6 m)    Weight:   Wt Readings from Last 1 Encounters:  05/15/18 44.2 kg    Ideal Body Weight:  52.3 kg  BMI:  Body mass index is 17.26 kg/m.  Estimated Nutritional Needs:   Kcal:  1450-1650  Protein:  60-75 grams  Fluid:  > 1.4 L    Lachell Rochette A. Jimmye Norman, RD, LDN, CDE Pager: [phone] After hours Pager: [phone]

## 2018-05-15 NOTE — Clinical Social Work Note (Signed)
Patient has orders to discharge home with hospice today.  CSW signing off.  Dayton Scrape, [place]

## 2018-05-15 NOTE — Progress Notes (Signed)
Patient not yet seen by home hospice today and will not have equipment delivered until AM. Plan for discharge in AM.    Dessa Phi, DO [facility] [email or web address] 05/15/2018, 3:27 PM

## 2018-05-15 NOTE — Progress Notes (Addendum)
°  Manufacturing engineer [facility]) Hospital Liaison: RN Note  Notified by [facility] of patient/family request for [facility] services at home after discharge. Chart and patient information under review by  [facility] physician. Hospice eligibility pending at this time.  Writer spoke with daughter via the telephone to initiate education related to hospice philosophy, services and team approach to care. Daughter verbalized understanding of information given. Per discussion, plan is for discharge home by personal car tomorrow.  Please send signed and completed DNR form home with patient/family. Patient will need prescriptions for discharge comfort medications.  DME needs have been discussed, patient currently has the following equipment in the home:walker, cane, wheelchair, bedside commode, and shower chair.  Patient/family requests the following DME for delivery to the home: Oxygen set up/tank supply. [place] equipment manager has been notified and will contact [facility] to arrange delivery to the home. Home address has been verified and is correct in the chart. Thurmon Fair (daughter) is the family member to contact to arrange time of delivery and her number is [phone].  [facility] aware of the above. Please notify [facility] when patient is ready to leave the unit at discharge. (Call [phone] or [phone] after 5pm.) [facility] information given to the daughter. Above information shared with [facility] Jacqualin Combes.  Please call with any hospice related questions.  Thank you for this referral.  Gar Ponto, RN [facility] Liaison (now found on [place]) [phone] ?  UPDATE: Pt is hospice eligible per [facility] MD

## 2018-05-15 NOTE — Care Management Note (Signed)
Case Management Note  Patient Details  Name: Maureen Mendez MRN: [id number] Date of Birth: 1929-11-28   Action/Plan: Received message that family is requesting to take patient home with hospice care; CM talked to daughter Crystal for home hospice choice, she chose [facility] [place]; referral made as requested.  Expected Discharge Date:   05/16/2018               Expected Discharge Plan:  [place]  In-House Referral:   Palliative Care  Discharge planning Services  CM Consult  Choice offered to:  Adult Children  HH Agency:  [facility]  Status of Service:  In process, will continue to follow  Sherrilyn Rist [phone] 05/15/2018, 12:10 PM

## 2018-05-15 NOTE — Discharge Summary (Addendum)
Physician Discharge Summary  Maureen Mendez AUQ:[id number] DOB: Apr 18, 1929 DOA: 05/07/2018  PCP: Patient, No Pcp Per  Admit date: 05/07/2018 Discharge date: 05/16/2018  Admitted From: Home Disposition:  Home with home hospice   Discharge Condition: Poor prognosis, hospice appropriate CODE STATUS: Full, would recommend continued goals of care conversation and code status discussion   Diet recommendation: Regular   Brief/Interim Summary: Maureen Mendez is an 85 year old with past medical history relevant for stage IV chronic kidney disease, history of CVA and apical thrombus as well as and paroxysmal atrial fibrillation on apixaban, chronic systolic heart failure EF of 35 to 40% with grade 2 diastolic dysfunction by echo on 04/08/2018, active smoker, who presented on 05/07/2018 with shortness of breath that was felt to be multifactorial, including acute on chronic systolic heart failure. Her hospital course has been complicated by acute metabolic encephalopathy. Patient has had a tenuous course; due to her continued mentation decline, dementia, heart failure, liver injury, chronic kidney disease, hemoptysis, pneumonia, palliative care was consulted. Family declined SNF placement. After further conversation, family decided to bring patient home with home hospice.   Discharge Diagnoses:  Acute metabolic encephalopathy -Head CT unremarkable except for severe chronic small vessel disease, ammonia normal -Dementia at baseline -Avoid sedating medications  Acute on chronic systolic heart failure  -Appreciate cardiology consultation, transitioned to oral furosemide. Cardiology signed off.  -Strict ins and outs, weigh daily -Continue metoprolol -Patient cannot tolerate ACE inhibitor due to AKI/CKD  Chronic hypoxemic respiratory failure -Continue nasal cannula O2  Left lower lobe pneumonia -CXR: New pleural based density on the left inferiorly likely related to early infiltrate -Omnicef x 5 days    Hemoptysis -Could be combination of heart failure, pneumonia, cough. Would not pursue further invasive procedures. Would recommend stopped eliquis.   Elevated LFTs -Right upper quadrant ultrasound: No intra or extrahepatic biliary duct dilatation. -Liver Doppler ultrasound: No evidence of portal vein thrombosis or hypertension. -Ammonia normal -LFT decreased, continue to trend  Stage IV CKD -Baseline creatinine ~2.5 -Hold nephrotoxins -Stable  COPD -Not exacerbation. Continue short-acting bronchodilators  History of CVA/left apical thrombus/paroxysmal atrial fibrillation -Continue apixaban, metoprolol  Hypertension -Continue metoprolol   Hyperlipidemia -Continue atorvastatin   GERD -Continue PPI   Discharge Instructions   Allergies as of 05/16/2018   No Known Allergies     Medication List    STOP taking these medications   metoprolol succinate 25 MG 24 hr tablet Commonly known as:  TOPROL-XL   potassium chloride 10 MEQ tablet Commonly known as:  K-DUR,KLOR-CON     TAKE these medications   acetaminophen 325 MG tablet Commonly known as:  TYLENOL Take 2 tablets (650 mg total) by mouth every 6 (six) hours as needed for mild pain (or Fever >/= 101). What changed:  how much to take   albuterol 108 (90 Base) MCG/ACT inhaler Commonly known as:  PROVENTIL HFA;VENTOLIN HFA Inhale 2 puffs into the lungs every 6 (six) hours as needed for wheezing or shortness of breath.   aspirin EC 81 MG tablet Take 81 mg by mouth daily.   atorvastatin 80 MG tablet Commonly known as:  LIPITOR Take 80 mg by mouth every evening.   calcitRIOL 0.5 MCG capsule Commonly known as:  ROCALTROL Take 0.5 mcg by mouth See admin instructions. Monday-Friday   cefdinir 300 MG capsule Commonly known as:  OMNICEF Take 1 capsule (300 mg total) by mouth daily for 3 days.   diclofenac sodium 1 % Gel Commonly known as:  VOLTAREN Apply 2  g topically 4 (four) times daily as needed  (pain).   docusate sodium 100 MG capsule Commonly known as:  COLACE Take 100 mg by mouth 2 (two) times daily as needed for mild constipation.   ELIQUIS 2.5 MG Tabs tablet Generic drug:  apixaban Take 1 tablet (2.5 mg total) by mouth 2 (two) times daily. What changed:  when to take this   furosemide 20 MG tablet Commonly known as:  LASIX Take 1 tablet (20 mg total) by mouth daily.   guaiFENesin-dextromethorphan 100-10 MG/5ML syrup Commonly known as:  ROBITUSSIN DM Take 5 mLs by mouth every 4 (four) hours as needed for cough (chest congestion).   hydrOXYzine 10 MG tablet Commonly known as:  ATARAX/VISTARIL Take 10 mg by mouth 3 (three) times daily as needed for anxiety.   metoprolol tartrate 25 MG tablet Commonly known as:  LOPRESSOR Take 1 tablet (25 mg total) by mouth 4 (four) times daily.   morphine 10 MG/5ML solution Take 1.3 mLs (2.6 mg total) by mouth every 6 (six) hours as needed for severe pain (shortness of breath).   omeprazole 20 MG capsule Commonly known as:  PRILOSEC Take 20 mg by mouth daily.   polyethylene glycol packet Commonly known as:  MIRALAX / GLYCOLAX Take 17 g by mouth daily as needed for mild constipation.   sodium bicarbonate 650 MG tablet Take 1 tablet (650 mg total) by mouth 3 (three) times daily.      Follow-up Information    [facility] Follow up.   Why:  They will do your Hospice care at your home Contact information: [place] [phone]         No Known Allergies  Consultations:  Cardiology  Palliative care    Procedures/Studies: Dg Chest 2 View  Result Date: 05/07/2018 CLINICAL DATA:  Shortness of breath.  Renal failure EXAM: CHEST - 2 VIEW COMPARISON:  April 09, 2018 FINDINGS: There is cardiomegaly with mild pulmonary venous hypertension. There are areas of scarring in the upper lobes. There is slight interstitial edema. There are small pleural  effusions bilaterally. No consolidation. There is aortic atherosclerosis. Bones are osteoporotic. IMPRESSION: Pulmonary vascular congestion with mild interstitial edema and small pleural effusions. No consolidation. Foci of scarring in the upper lobes, stable. Aortic Atherosclerosis (ICD10-I70.0). Electronically Signed   By: Lowella Grip III M.D.   On: 05/07/2018 07:37   Ct Head Wo Contrast  Result Date: 05/11/2018 CLINICAL DATA:  Loss of consciousness.  History of stroke. EXAM: CT HEAD WITHOUT CONTRAST TECHNIQUE: Contiguous axial images were obtained from the base of the skull through the vertex without intravenous contrast. COMPARISON:  None. FINDINGS: BRAIN: No intraparenchymal hemorrhage, mass effect nor midline shift. Moderate to severe parenchymal brain volume loss for age. No hydrocephalus. Confluent supratentorial white matter hypodensities. Old RIGHT thalamus infarct. Heterogeneous basal ganglia seen with chronic small vessel ischemic changes. Old RIGHT cerebellar infarcts. No acute large vascular territory infarcts. No abnormal extra-axial fluid collections. Basal cisterns are patent. VASCULAR: Moderate to severe calcific atherosclerosis of the carotid siphons. SKULL: No skull fracture. No significant scalp soft tissue swelling. SINUSES/ORBITS: Trace paranasal sinus mucosal thickening. Mastoid air cells are well aerated.The included ocular globes and orbital contents are non-suspicious. Status post RIGHT ocular lens implant. OTHER: None. IMPRESSION: 1. No acute intracranial process. 2. Severe chronic small vessel ischemic changes. Old RIGHT thalamus and RIGHT cerebellar infarcts. 3. Moderate to severe parenchymal brain volume loss. Electronically Signed  By: Elon Alas M.D.   On: 05/11/2018 16:03   Dg Chest Port 1 View  Result Date: 05/13/2018 CLINICAL DATA:  Hypoxia EXAM: PORTABLE CHEST 1 VIEW COMPARISON:  05/07/2018 FINDINGS: Cardiac shadow remains enlarged. The lungs are well  aerated bilaterally. New density is noted along the lateral chest wall inferiorly on the left consistent with early infiltrate. No sizable effusion is noted. No acute bony abnormality is noted. IMPRESSION: New pleural based density on the left inferiorly likely related to early infiltrate. Electronically Signed   By: Inez Catalina M.D.   On: 05/13/2018 08:18   US Liver Doppler  Result Date: 05/11/2018 CLINICAL DATA:  Elevated LFTs EXAM: DUPLEX ULTRASOUND OF LIVER TECHNIQUE: Color and duplex Doppler ultrasound was performed to evaluate the hepatic in-flow and out-flow vessels. COMPARISON:  None. FINDINGS: Liver: Heterogeneous echotexture. Normal hepatic contour without nodularity. No focal lesion, mass or intrahepatic biliary ductal dilatation. Main Portal Vein size: 0.7 cm Portal Vein Velocities Main Prox:  26.7 cm/sec Main Mid: 27.3 cm/sec Main Dist:  33.7 cm/sec Right: 24.2 cm/sec Left: 22.6 cm/sec Hepatic Vein Velocities Right:  Is 28.1 cm/sec Middle:  28.4 cm/sec Left:  29.5 cm/sec IVC: Present and patent with normal respiratory phasicity. Hepatic Artery Velocity:  55.6 cm/sec Splenic Vein Velocity:  36.7 cm/sec Spleen: 2.7 cm x 4.2 cm x 2.0 cm with a total volume of 11.8 cm^3 (411 cm^3 is upper limit normal) Portal Vein Occlusion/Thrombus: No Splenic Vein Occlusion/Thrombus: No Ascites: None Varices: None Small cyst seen in the kidneys bilaterally. Bilateral pleural effusions noted. IMPRESSION: No evidence of portal vein thrombosis or hypertension. Electronically Signed   By: Rolm Baptise M.D.   On: 05/11/2018 17:36   US Abdomen Limited Ruq  Result Date: 05/11/2018 CLINICAL DATA:  Elevated LFTs. EXAM: ULTRASOUND ABDOMEN LIMITED RIGHT UPPER QUADRANT COMPARISON:  None. FINDINGS: Gallbladder: No gallstones or gallbladder wall thickening. No pericholecystic fluid. The sonographer reports no sonographic Murphy's sign. Common bile duct: Diameter: 2 mm Liver: No focal lesion identified. Within normal limits in  parenchymal echogenicity. Portal vein is patent on color Doppler imaging with normal direction of blood flow towards the liver. Incidental: Right pleural effusion evident. Small cyst identified adjacent to the right hepatic lobe, indeterminate. IMPRESSION: No intra or extrahepatic biliary duct dilatation. Electronically Signed   By: Misty Stanley M.D.   On: 05/11/2018 17:16       Discharge Exam: Vitals:   05/15/18 2122 05/16/18 0617  BP: 92/77 (!) 123/92  Pulse: 66 83  Resp:  18  Temp:  98 F (36.7 C)  SpO2:      General: Pt is alert to voice, frail and elderly, chronically ill appearing  Cardiovascular: Irregular rhythm, no edema  Respiratory: CTA bilaterally, no wheezing, no rhonchi Abdominal: Soft, NT, ND, bowel sounds + Extremities: no edema, no cyanosis    The results of significant diagnostics from this hospitalization (including imaging, microbiology, ancillary and laboratory) are listed below for reference.     Microbiology: No results found for this or any previous visit (from the past 240 hour(s)).   Labs: BNP (last 3 results) Recent Labs    04/06/18 2217 05/07/18 0857  BNP 3,300.9* 9,892.1*   Basic Metabolic Panel: Recent Labs  Lab 05/10/18 0405 05/11/18 0651 05/12/18 0455 05/14/18 0833 05/15/18 0746  NA 138 137 138 139 138  K 4.8 4.7 3.9 3.9 3.9  CL 108 106 105 103 105  CO2 17* 17* 21* 23 23  GLUCOSE 124* 111* 108* 120* 121*  BUN 62* 69* 68* 60* 61*  CREATININE 3.00* 2.96* 2.94* 2.50* 2.47*  CALCIUM 8.5* 8.4* 8.4* 8.1* 8.2*  MG  --  2.3  --  2.1  --    Liver Function Tests: Recent Labs  Lab 05/11/18 0651 05/12/18 0455 05/14/18 0833 05/15/18 0746  AST 183* 193* 125* 74*  ALT 243* 255* 216* 165*  ALKPHOS 164* 150* 139* 125  BILITOT 2.0* 1.6* 1.2 1.2  PROT 5.8* 5.5* 5.3* 4.9*  ALBUMIN 2.6* 2.4* 2.2* 2.1*   No results for input(s): LIPASE, AMYLASE in the last 168 hours. Recent Labs  Lab 05/11/18 0940  AMMONIA 29   CBC: Recent Labs   Lab 05/11/18 0651 05/12/18 0455 05/14/18 0833 05/15/18 0746  WBC 13.1* 10.5 12.1* 12.1*  HGB 11.8* 11.7* 13.0 13.3  HCT 36.3 35.0* 39.8 39.5  MCV 89.9 87.9 88.4 88.6  PLT 95* 91* 88* 84*   Cardiac Enzymes: No results for input(s): CKTOTAL, CKMB, CKMBINDEX, TROPONINI in the last 168 hours. BNP: Invalid input(s): POCBNP CBG: No results for input(s): GLUCAP in the last 168 hours. D-Dimer No results for input(s): DDIMER in the last 72 hours. Hgb A1c No results for input(s): HGBA1C in the last 72 hours. Lipid Profile No results for input(s): CHOL, HDL, LDLCALC, TRIG, CHOLHDL, LDLDIRECT in the last 72 hours. Thyroid function studies No results for input(s): TSH, T4TOTAL, T3FREE, THYROIDAB in the last 72 hours.  Invalid input(s): FREET3 Anemia work up No results for input(s): VITAMINB12, FOLATE, FERRITIN, TIBC, IRON, RETICCTPCT in the last 72 hours. Urinalysis    Component Value Date/Time   COLORURINE YELLOW 05/07/2018 1630   APPEARANCEUR HAZY (A) 05/07/2018 1630   LABSPEC 1.013 05/07/2018 1630   PHURINE 5.0 05/07/2018 1630   GLUCOSEU NEGATIVE 05/07/2018 1630   HGBUR SMALL (A) 05/07/2018 1630   BILIRUBINUR NEGATIVE 05/07/2018 1630   KETONESUR NEGATIVE 05/07/2018 1630   PROTEINUR 30 (A) 05/07/2018 1630   NITRITE NEGATIVE 05/07/2018 1630   LEUKOCYTESUR NEGATIVE 05/07/2018 1630   Sepsis Labs Invalid input(s): PROCALCITONIN,  WBC,  LACTICIDVEN Microbiology No results found for this or any previous visit (from the past 240 hour(s)).   Patient was seen and examined on the day of discharge and was found to be in stable condition. Time coordinating discharge: 45 minutes including assessment and coordination of care, as well as examination of the patient.   SIGNED:  Dessa Phi, DO [facility] [email or web address] 05/16/2018, 8:46 AM

## 2018-05-15 NOTE — Progress Notes (Signed)
CM spoke to [place] with [employer]. There is a representative from [employer] on the way to speak with the patient and family. Pt is going to need DME for the home. DME wont be delivered until either late this pm or in the am. Pt to stay overnight and d/c in the am. MD updated and in agreement. CM following for d/c needs.

## 2018-05-16 MED ORDER — MORPHINE SULFATE 10 MG/5ML PO SOLN: 2.5000 mg | Freq: Four times a day (QID) | ORAL | 0 refills | Status: AC | PRN

## 2018-05-16 NOTE — Progress Notes (Addendum)
PT Cancellation Note  Patient Details Name: Maureen Mendez MRN: [id number] DOB: 27-Dec-1929   Cancelled Treatment:    Reason Eval/Treat Not Completed: Other (comment). Pt and niece sleeping in room. Noted pt to return home with hospice today. Will follow-up for PT treatment if pt to remain admitted; can provide caregiver training if daughter present and interested.  1:35pm - Patient awakens to voice, declining PT treatment session, wanting to sleep. No family present. Will follow up if pt to remain admitted.  Mabeline Caras, PT, DPT Acute Rehabilitation Services  Pager [phone] Office [place] 05/16/2018, 9:26 AM

## 2018-05-16 NOTE — Progress Notes (Addendum)
Call made to pt's daughter Crystal to confirm that DME has been delivered to the home. Per Crystal all DME has arrived and they are ready for pt to transport home. [facility] has been called and transport has been set up for 3:00 pm. Spoke with bedside RN Mikle Bosworth- who is aware of transport time. Daughter is to come pick up family member that is at the bedside. Paperwork for [facility] placed on shadow chart for transport. - Hospice notified of discharge time  [facility] Cross Coverage for [facility] RN [phone]

## 2018-05-16 NOTE — Progress Notes (Signed)
  PROGRESS NOTE  No acute events overnight. Sleeping comfortably with niece at bedside. Plan for dc home today with home hospice service.    Dessa Phi, DO [facility] [email or web address] 05/16/2018, 8:45 AM

## 2018-06-18 DEATH — deceased

## 2020-08-22 IMAGING — CT CT HEAD W/O CM
3 series · 15 of 47 positions shown, 18 images · non-contrast
Comparison: None.

CLINICAL DATA: Loss of consciousness.  History of stroke.

EXAM:
CT HEAD WITHOUT CONTRAST
TECHNIQUE: Contiguous axial images were obtained from the base of the skull
through the vertex without intravenous contrast.

[Series 3: head 5.0 h30s · axial · 0.42mm/px · z∈[+1143,+1278]mm · 9 of 33 slices shown, 12 images]
[im 3/33  brain]
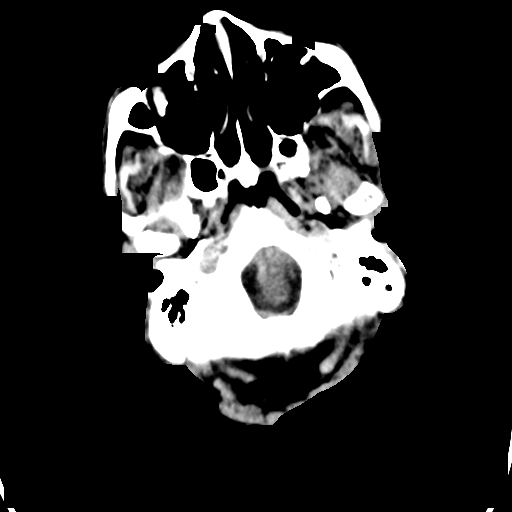
[im 3/33  bone]
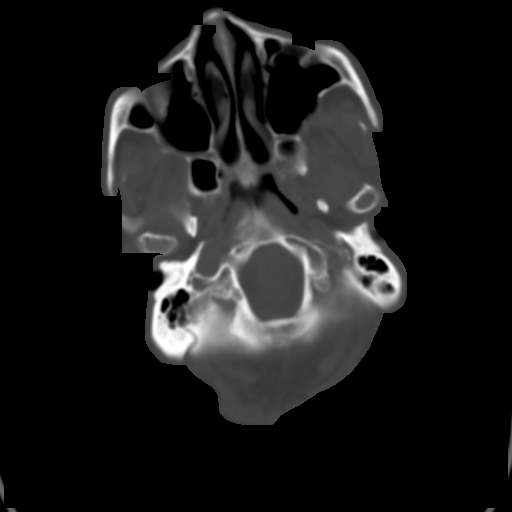
[im 6/33  brain]
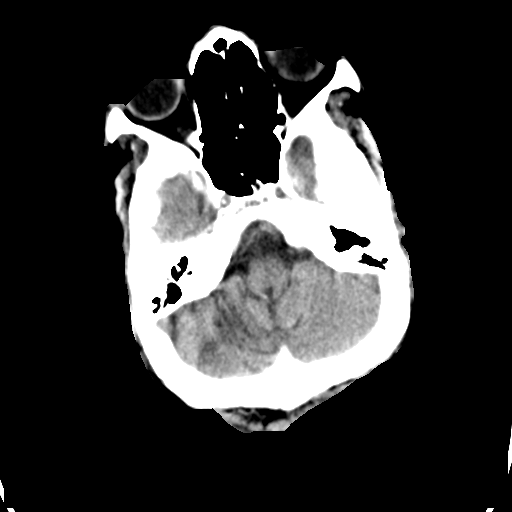
[im 9/33  brain]
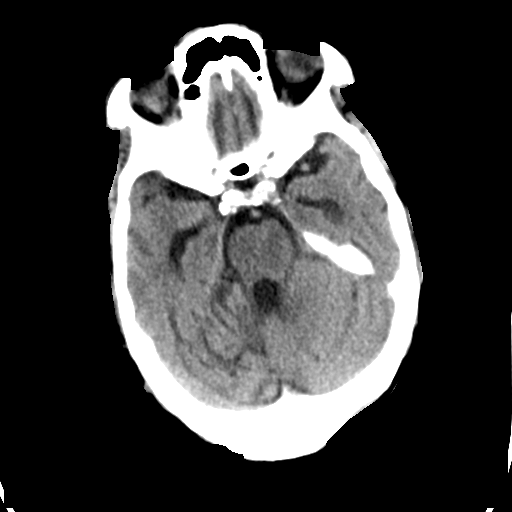
[im 13/33  brain]
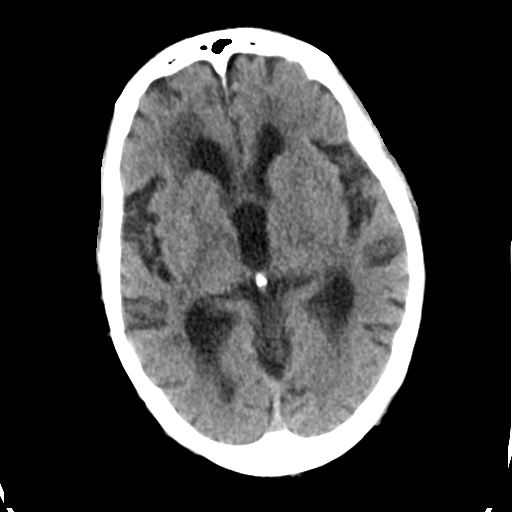
[im 17/33  brain]
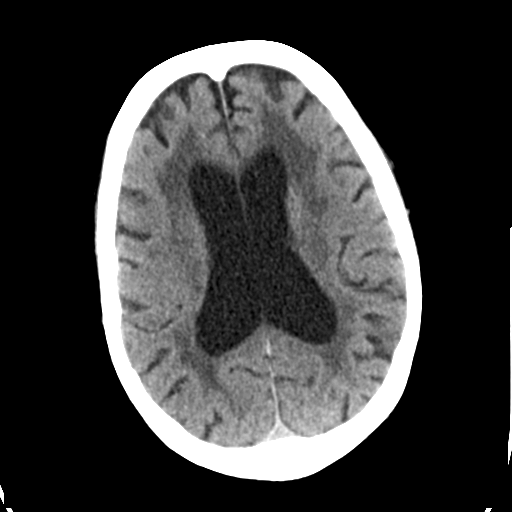
[im 17/33  bone]
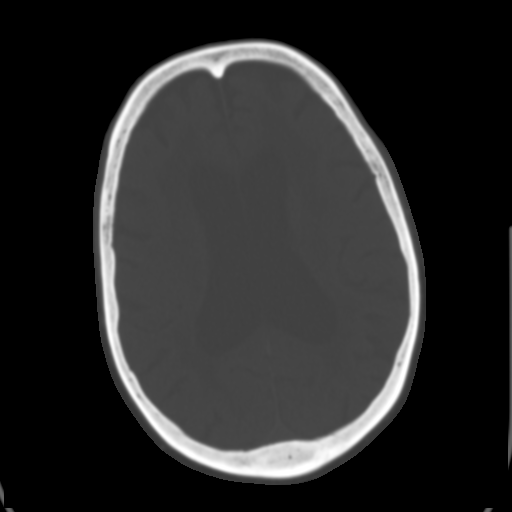
[im 20/33  brain]
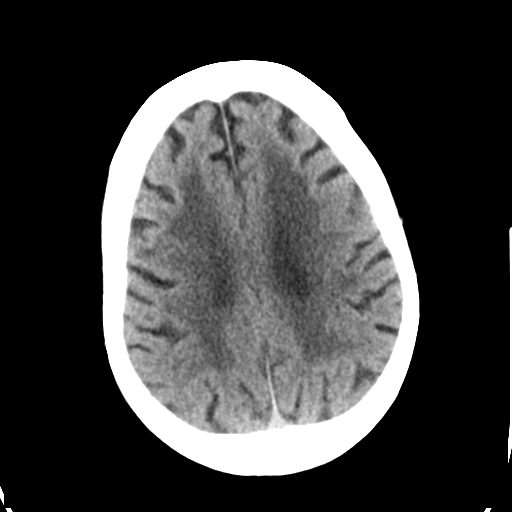
[im 24/33  brain]
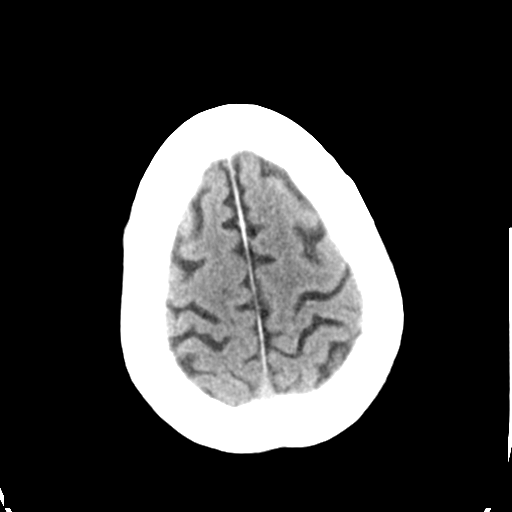
[im 27/33  brain]
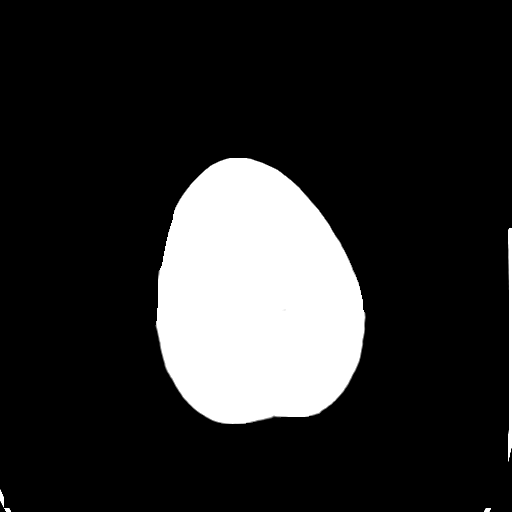
[im 30/33  brain]
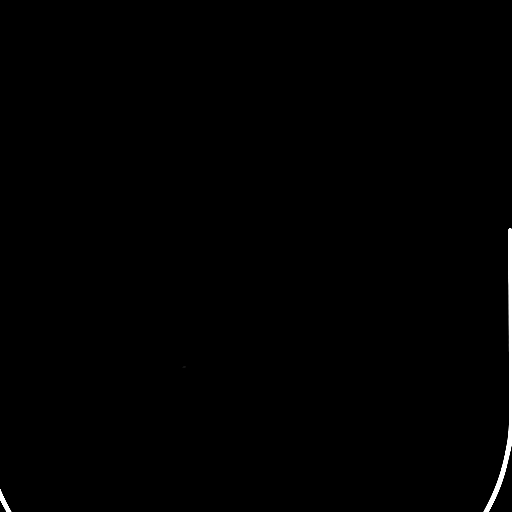
[im 30/33  bone]
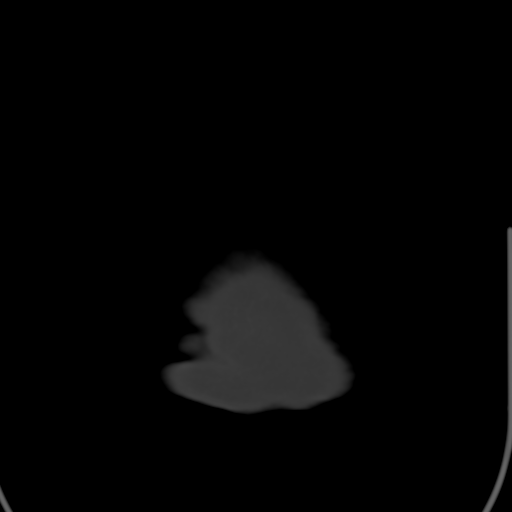

[Series 5: head 3.0 mpr cor · coronal · 0.30mm/px · 3 of 70 slices shown]
[im 24/70  brain]
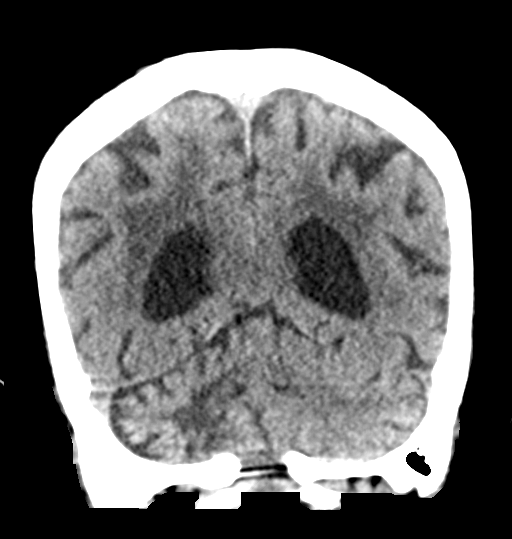
[im 31/70  brain]
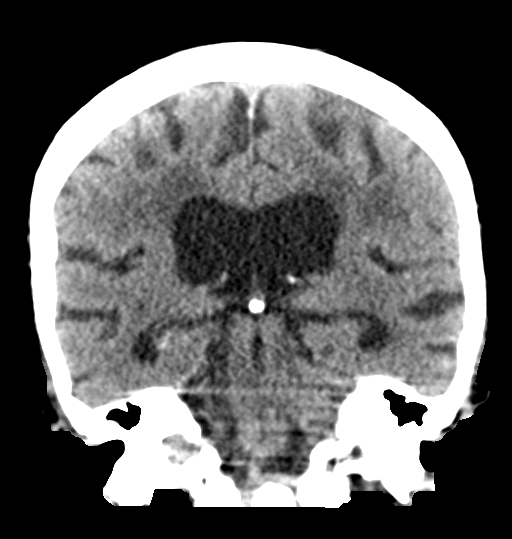
[im 39/70  brain]
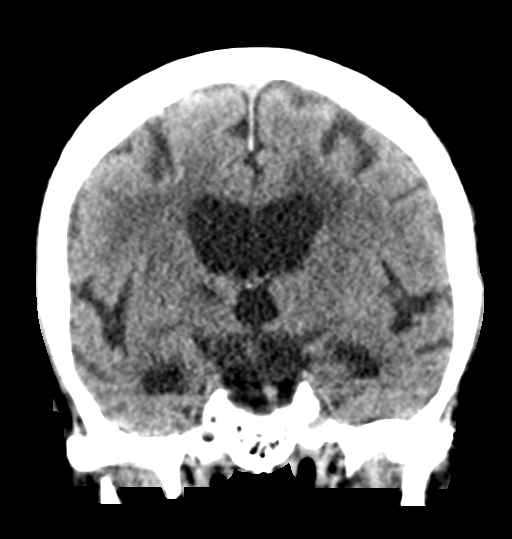

[Series 6: head 3.0 mpr sag · sagittal · 0.32mm/px · 3 of 52 slices shown]
[im 18/52  brain]
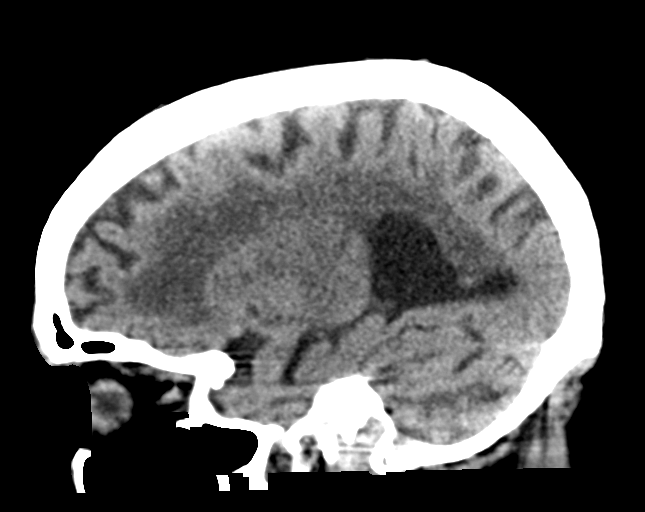
[im 26/52  brain]
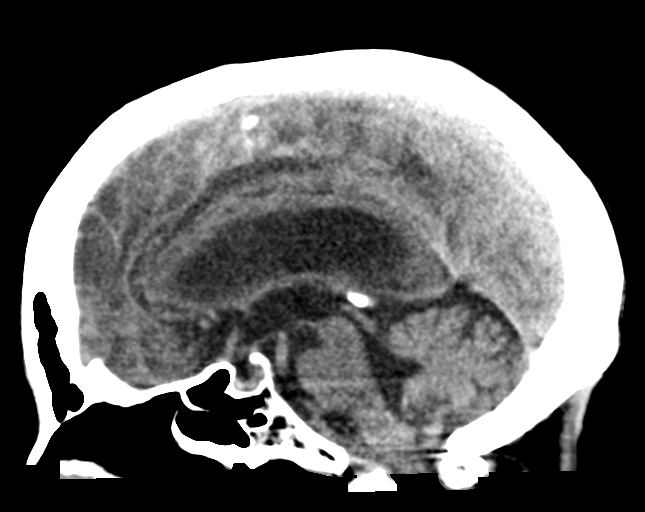
[im 35/52  brain]
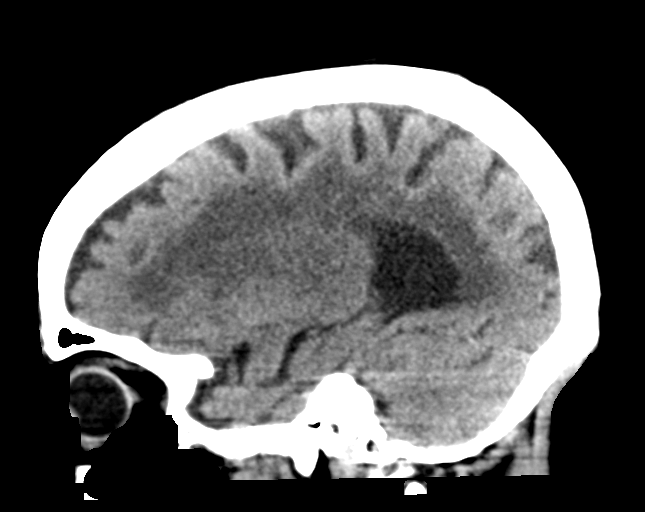

[15 of 47 positions shown; findings below may reference images not displayed]

FINDINGS: BRAIN: No intraparenchymal hemorrhage, mass effect nor midline
shift. Moderate to severe parenchymal brain volume loss for age. No
hydrocephalus. Confluent supratentorial white matter hypodensities.
Old RIGHT thalamus infarct. Heterogeneous basal ganglia seen with
chronic small vessel ischemic changes. Old RIGHT cerebellar
infarcts. No acute large vascular territory infarcts. No abnormal
extra-axial fluid collections. Basal cisterns are patent.

VASCULAR: Moderate to severe calcific atherosclerosis of the carotid
siphons.

SKULL: No skull fracture. No significant scalp soft tissue swelling.

SINUSES/ORBITS: Trace paranasal sinus mucosal thickening. Mastoid
air cells are well aerated.The included ocular globes and orbital
contents are non-suspicious. Status post RIGHT ocular lens implant.

OTHER: None.
IMPRESSION: 1. No acute intracranial process.
2. Severe chronic small vessel ischemic changes. Old RIGHT thalamus
and RIGHT cerebellar infarcts.
3. Moderate to severe parenchymal brain volume loss.

## 2020-08-24 IMAGING — DX DG CHEST 1V PORT
1 series · 1 of 1 positions shown · non-contrast
Comparison: 05/07/2018

CLINICAL DATA: Hypoxia

EXAM:
PORTABLE CHEST 1 VIEW

[chest ap]
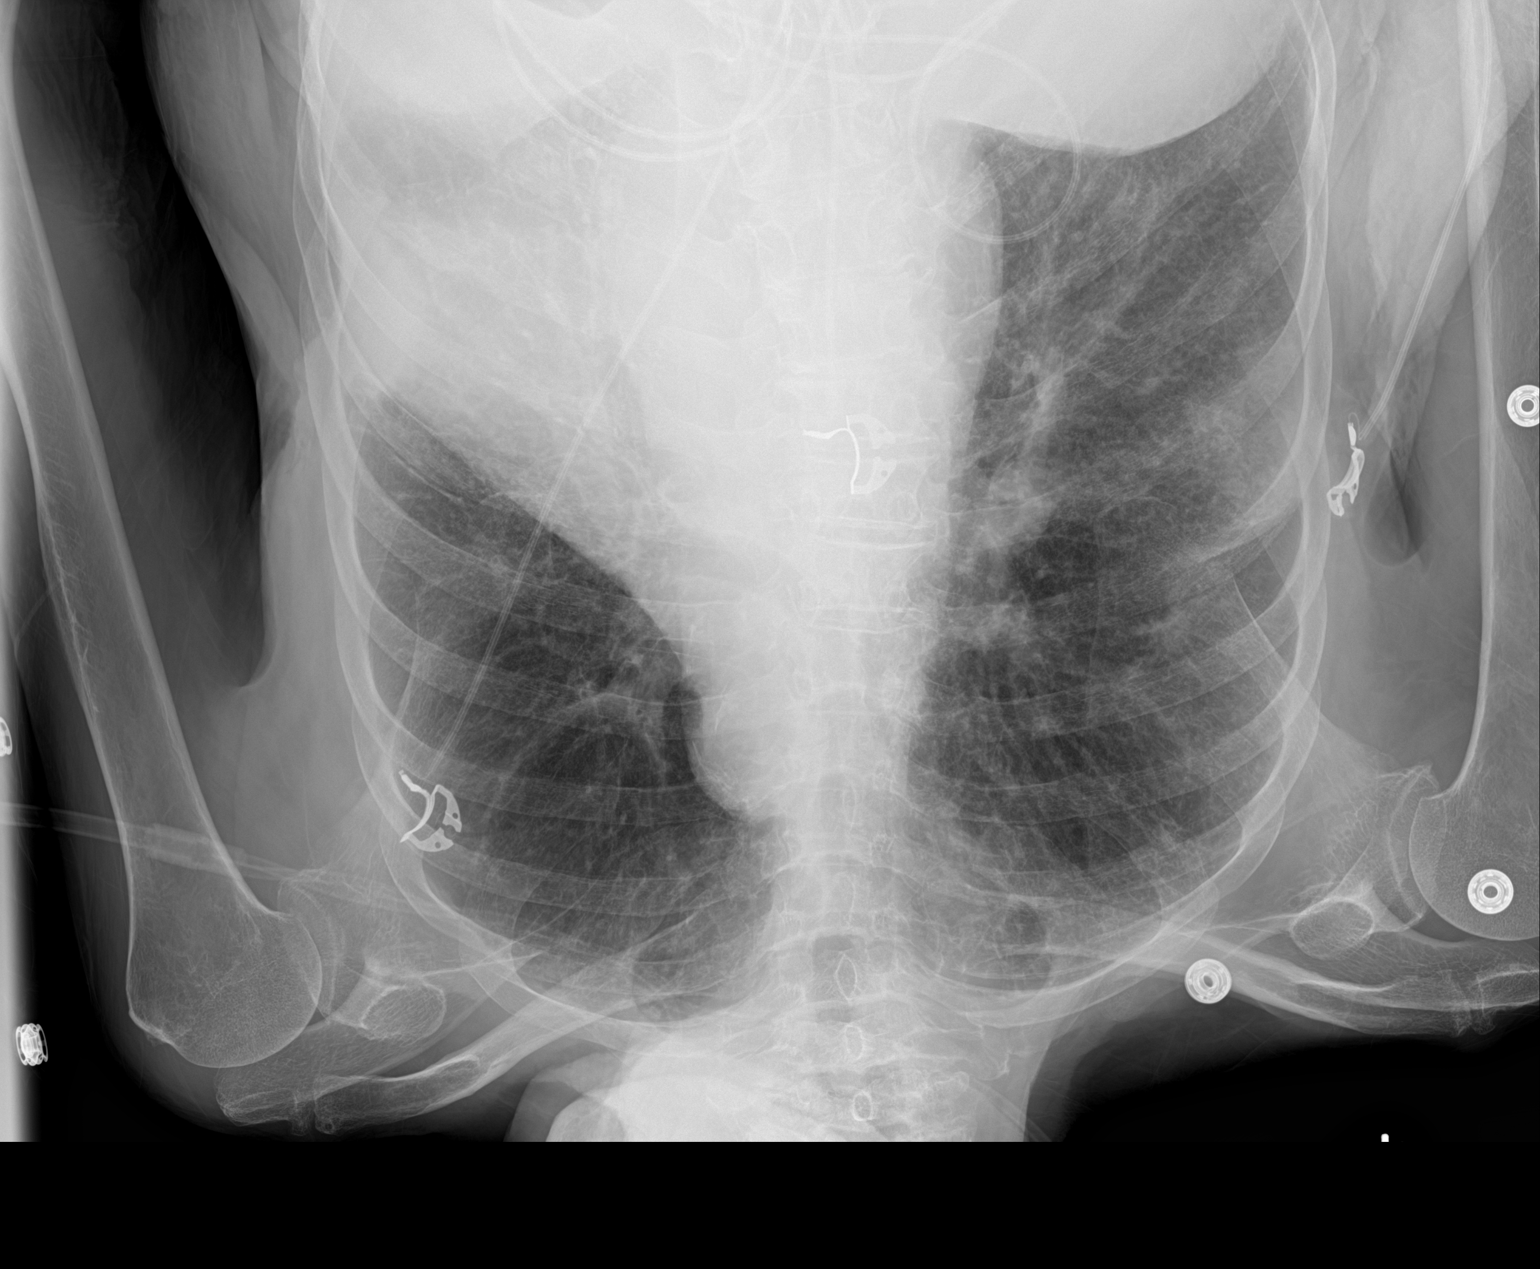

[1 of 1 positions shown; findings below may reference images not displayed]

FINDINGS: Cardiac shadow remains enlarged. The lungs are well aerated
bilaterally. New density is noted along the lateral chest wall
inferiorly on the left consistent with early infiltrate. No sizable
effusion is noted. No acute bony abnormality is noted.
IMPRESSION: New pleural based density on the left inferiorly likely related to
early infiltrate.
